# Patient Record
Sex: Male | Born: 1975 | Race: White | Hispanic: No | Marital: Single | Smoking: Current every day smoker
Health system: Southern US, Community
[De-identification: ages and names within clinical notes are randomized; demographics above are authoritative.]

## PROBLEM LIST (undated history)

## (undated) DIAGNOSIS — E7800 Pure hypercholesterolemia, unspecified: Secondary | ICD-10-CM

## (undated) DIAGNOSIS — M549 Dorsalgia, unspecified: Secondary | ICD-10-CM

## (undated) DIAGNOSIS — M109 Gout, unspecified: Secondary | ICD-10-CM

## (undated) DIAGNOSIS — I10 Essential (primary) hypertension: Secondary | ICD-10-CM

---
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
## 2000-11-29 HISTORY — PX: WRIST SURGERY: SHX841

## 2002-05-22 ENCOUNTER — Emergency Department (HOSPITAL_COMMUNITY): Admission: EM | Admit: 2002-05-22 | Discharge: 2002-05-22 | Payer: Self-pay | Admitting: *Deleted

## 2002-07-13 ENCOUNTER — Ambulatory Visit (HOSPITAL_BASED_OUTPATIENT_CLINIC_OR_DEPARTMENT_OTHER): Admission: RE | Admit: 2002-07-13 | Discharge: 2002-07-13 | Payer: Self-pay | Admitting: Orthopedic Surgery

## 2003-04-17 ENCOUNTER — Emergency Department (HOSPITAL_COMMUNITY): Admission: EM | Admit: 2003-04-17 | Discharge: 2003-04-18 | Payer: Self-pay | Admitting: Emergency Medicine

## 2010-06-18 ENCOUNTER — Emergency Department (HOSPITAL_COMMUNITY): Admission: EM | Admit: 2010-06-18 | Discharge: 2010-06-18 | Payer: Self-pay | Admitting: Emergency Medicine

## 2011-04-16 NOTE — H&P (Signed)
   NAME:  Ian Wall, Ian Wall                          ACCOUNT NO.:  [id number]   MEDICAL RECORD NO.:  [id number]                   PATIENT TYPE:  MS   LOCATION:  ED                                   FACILITY:  [facility]   PHYSICIAN:  Nicki Reaper, M.D.                 DATE OF BIRTH:  February 28, 1976   DATE OF ADMISSION:  DATE OF DISCHARGE:                                HISTORY & PHYSICAL   PREOPERATIVE DIAGNOSIS:  Laceration extensor pollicis longus left thumb.   POSTOPERATIVE DIAGNOSIS:  Laceration extensor pollicis longus left thumb.   OPERATION:  Repair extensor pollicis longus left thumb.   SURGEON:  Nicki Reaper, M.D.   ANESTHESIA:  IV regional.   DATE OF OPERATION:  07/13/02.   ANESTHESIOLOGIST:  Guadalupe Maple, M.D.   HISTORY:  The patient is a 35 year old male who suffered a laceration of his  metacarpal of his left thumb.  He has a laceration to the EPL tendon with  retraction.   PROCEDURE:  The patient was brought to the operating room where an upper arm  IV regional anesthetic was carried out without difficulty.  He was prepped  and draped using Duraprep.  The sutures were removed.  The laceration  extended proximally and distally.  The distal end was found without  difficulty.  The proximal end was at the wrist with undermining of the soft  tissues.  This was able to be found in its tract and delivered distally,  pinned with a bony 1 gauge needle and a repaired performed with a modified  Kessler using 4-0 Mersilene as a court suture and figure-of-eight 4-0  Mersilene in the upper tenon.  The wound was irrigated.  Skin was closed  with interrupted 5-0 nylon sutures.  A sterile compressive dressing and  splint including the thumb and wrist was applied.   The patient tolerated the procedure well and was taken to the recovery room  for observation in satisfactory condition.  He is discharged home to return  to the [facility] in [place] in one week on Vicodin and  Keflex.                                               Nicki Reaper, M.D.    GRK/MEDQ  D:  07/13/2002  T:  07/17/2002  Job:  [id number]

## 2011-06-12 ENCOUNTER — Emergency Department (HOSPITAL_COMMUNITY)
Admission: EM | Admit: 2011-06-12 | Discharge: 2011-06-12 | Disposition: A | Discharge status: Home / Self Care | Payer: Self-pay | Attending: Emergency Medicine | Admitting: Emergency Medicine

## 2011-06-12 DIAGNOSIS — F17200 Nicotine dependence, unspecified, uncomplicated: Secondary | ICD-10-CM | POA: Insufficient documentation

## 2011-06-12 DIAGNOSIS — K047 Periapical abscess without sinus: Secondary | ICD-10-CM | POA: Insufficient documentation

## 2011-06-12 DIAGNOSIS — R220 Localized swelling, mass and lump, head: Secondary | ICD-10-CM | POA: Insufficient documentation

## 2011-06-12 DIAGNOSIS — K029 Dental caries, unspecified: Secondary | ICD-10-CM | POA: Insufficient documentation

## 2011-06-12 DIAGNOSIS — R221 Localized swelling, mass and lump, neck: Secondary | ICD-10-CM | POA: Insufficient documentation

## 2011-06-12 HISTORY — DX: Dorsalgia, unspecified: M54.9

## 2011-06-12 LAB — BASIC METABOLIC PANEL
BUN: 13 mg/dL (ref 6–23)
Creatinine, Ser: 0.89 mg/dL (ref 0.50–1.35)
GFR calc Af Amer: >60 mL/min (ref >60)
GFR calc non Af Amer: >60 mL/min (ref >60)

## 2011-06-12 LAB — CBC
HCT: 45 % (ref 39.0–52.0)
Hemoglobin: 15.2 g/dL (ref 13.0–17.0)
MCH: 31.1 pg (ref 26.0–34.0)
MCHC: 33.8 g/dL (ref 30.0–36.0)
MCV: 92 fL (ref 78.0–100.0)

## 2011-06-12 LAB — DIFFERENTIAL
Basophils Relative: 0 % (ref 0–1)
Eosinophils Absolute: 1.2 10*3/uL — ABNORMAL HIGH (ref 0.0–0.7)
Eosinophils Relative: 9 % — ABNORMAL HIGH (ref 0–5)
Monocytes Absolute: 0.8 10*3/uL (ref 0.1–1.0)
Monocytes Relative: 6 % (ref 3–12)

## 2011-06-12 MED ORDER — HYDROCODONE-ACETAMINOPHEN 5-500 MG PO TABS: 1.0000 | ORAL_TABLET | Freq: Four times a day (QID) | ORAL | Status: AC | PRN

## 2011-06-12 MED ORDER — PENICILLIN V POTASSIUM 500 MG PO TABS: 500.0000 mg | ORAL_TABLET | Freq: Four times a day (QID) | ORAL | Status: AC

## 2011-06-12 NOTE — ED Notes (Signed)
Patient with no complaints at this time. Respirations even and unlabored. Skin warm/dry. Discharge instructions reviewed with patient at this time. Patient given opportunity to voice concerns/ask questions. Patient discharged at this time and left Emergency Department with steady gait.   

## 2011-06-12 NOTE — ED Notes (Signed)
Pt presents with left sided facial swelling x 1 month. Pt states prior to facial swelling pt had a toothache that gradually increased.

## 2011-06-12 NOTE — ED Notes (Signed)
MD at bedside. 

## 2011-06-12 NOTE — ED Provider Notes (Addendum)
History     Chief Complaint  Patient presents with  . Facial Swelling   The history is provided by the patient. No language interpreter was used.  It started with left 1-2 molar dental pain 3 months ago and then developed facial swelling over the angle of the left mandible and pain with chewing.  No fever/c/r.  No dysphagia, no odynophagia, no swelling of the neck.  No weight loss.  No night sweats.  No trismus.  Pain is a 10/10 at worst. Non radiating.  No rashes on the skin.  No CP, SOB, no wheezing or stridor. No easy bruising or bleeding, no gum bleeding  Past Medical History  Diagnosis Date  . Gout   . Back pain     History reviewed. No pertinent past surgical history.  History reviewed. No pertinent family history.  History  Substance Use Topics  . Smoking status: Current Everyday Smoker -- 1.0 packs/day    Types: Cigarettes  . Smokeless tobacco: Not on file  . Alcohol Use: No      Review of Systems  Constitutional: Negative for activity change.  HENT: Positive for facial swelling. Negative for hearing loss, nosebleeds, congestion, rhinorrhea, sneezing, neck pain, neck stiffness, postnasal drip, tinnitus and ear discharge.   Eyes: Negative for discharge.  Respiratory: Negative for apnea.   Cardiovascular: Negative for chest pain.  Gastrointestinal: Negative for abdominal distention.  Genitourinary: Negative for difficulty urinating.  Musculoskeletal: Negative for arthralgias.  Neurological: Negative for dizziness.  Hematological: Negative for adenopathy.  Psychiatric/Behavioral: Negative for agitation.    Physical Exam  BP 168/94  Pulse 90  Temp(Src) 98.6 F (37 C) (Oral)  Resp 18  Ht 5\' 7"  (1.702 m)  Wt 235 lb (106.595 kg)  BMI 36.81 kg/m2  SpO2 99%  Physical Exam  Constitutional: He is oriented to person, place, and time. He appears well-developed and well-nourished.  HENT:  Head: Normocephalic and atraumatic. No trismus in the jaw.  Right Ear:  External ear normal.  Left Ear: External ear normal.  Nose: Nose normal.  Mouth/Throat: Oropharynx is clear and moist and mucous membranes are normal. Mucous membranes are not pale and not cyanotic. No oral lesions. Abnormal dentition. Dental caries present. No dental abscesses, uvula swelling or lacerations. No oropharyngeal exudate, posterior oropharyngeal edema, posterior oropharyngeal erythema or tonsillar abscesses.         No appreciable facial swelling on the left, no trismus.    Eyes: EOM are normal. Pupils are equal, round, and reactive to light.  Neck: Trachea normal and normal range of motion. Neck supple. Normal carotid pulses and no JVD present. Carotid bruit is not present. No Brudzinski's sign and no Kernig's sign noted. No mass and no thyromegaly present.  Cardiovascular: Normal rate and regular rhythm.   Pulmonary/Chest: Effort normal and breath sounds normal.  Abdominal: Soft. Bowel sounds are normal.  Musculoskeletal: Normal range of motion.  Lymphadenopathy:       Head (right side): No submental, no submandibular, no tonsillar, no preauricular, no posterior auricular and no occipital adenopathy present.       Head (left side): Submandibular adenopathy present. No submental, no tonsillar, no preauricular, no posterior auricular and no occipital adenopathy present.    He has no cervical adenopathy.       Right cervical: No superficial cervical and no deep cervical adenopathy present.      Left cervical: No superficial cervical and no deep cervical adenopathy present.    He has no axillary adenopathy.  Right: No supraclavicular and no epitrochlear adenopathy present.       Left: No supraclavicular and no epitrochlear adenopathy present.       1 cm freely mobile  Neurological: He is alert and oriented to person, place, and time.  Skin: Skin is warm and dry.  Psychiatric: He has a normal mood and affect.    ED Course  Procedures  MDM  Patient informed he should  follow up with dentistry.  Return immediately for worsening swelling, fevers, neck stiffness, night sweats or any concerning symptoms and to follow up with family doctor.  Patient and wife verbalize understanding and agree to follow up     Rikia Sukhu K Kacelyn Rowzee-Rasch, MD 06/12/11 1845  Joselyn Edling K Paulette Lynch-Rasch, MD 06/13/11 2142

## 2011-06-20 ENCOUNTER — Emergency Department (HOSPITAL_COMMUNITY)
Admission: EM | Admit: 2011-06-20 | Discharge: 2011-06-20 | Disposition: A | Discharge status: Home / Self Care | Payer: Self-pay | Attending: Emergency Medicine | Admitting: Emergency Medicine

## 2011-06-20 ENCOUNTER — Encounter (HOSPITAL_COMMUNITY): Payer: Self-pay | Admitting: *Deleted

## 2011-06-20 DIAGNOSIS — K029 Dental caries, unspecified: Secondary | ICD-10-CM | POA: Insufficient documentation

## 2011-06-20 DIAGNOSIS — F17200 Nicotine dependence, unspecified, uncomplicated: Secondary | ICD-10-CM | POA: Insufficient documentation

## 2011-06-20 MED ORDER — HYDROCODONE-ACETAMINOPHEN 5-325 MG PO TABS: 1.0000 | ORAL_TABLET | ORAL | Status: AC | PRN

## 2011-06-20 MED ORDER — ONDANSETRON HCL 4 MG PO TABS
4.0000 mg | ORAL_TABLET | Freq: Once | ORAL | Status: AC
  Administered 2011-06-20: 4 mg via ORAL
  Filled 2011-06-20: qty 1

## 2011-06-20 MED ORDER — PENICILLIN V POTASSIUM 250 MG PO TABS
500.0000 mg | ORAL_TABLET | Freq: Once | ORAL | Status: AC
  Administered 2011-06-20: 500 mg via ORAL
  Filled 2011-06-20: qty 2

## 2011-06-20 MED ORDER — IBUPROFEN 800 MG PO TABS
800.0000 mg | ORAL_TABLET | Freq: Once | ORAL | Status: AC
  Administered 2011-06-20: 800 mg via ORAL
  Filled 2011-06-20: qty 1

## 2011-06-20 MED ORDER — AMOXICILLIN 500 MG PO CAPS: ORAL_CAPSULE | ORAL | Status: DC

## 2011-06-20 NOTE — Progress Notes (Signed)
Plan discussed with patient. Discussed need for pt to see a dentist as soon as possible. Pt acknowledges instruction and states he is trying to get into a free dental clinic.

## 2011-06-20 NOTE — ED Provider Notes (Signed)
History     Chief Complaint  Patient presents with  . Dental Pain   Patient is a 35 y.o. male presenting with tooth pain. The history is provided by the patient.  Dental PainThe primary symptoms include mouth pain. Primary symptoms do not include shortness of breath or cough. The symptoms began more than 1 month ago. The symptoms are worsening. The symptoms occur constantly.  Additional symptoms include: dental sensitivity to temperature and gum swelling. Additional symptoms do not include: nosebleeds.    Past Medical History  Diagnosis Date  . Gout   . Back pain     History reviewed. No pertinent past surgical history.  No family history on file.  History  Substance Use Topics  . Smoking status: Current Everyday Smoker -- 1.0 packs/day    Types: Cigarettes  . Smokeless tobacco: Not on file  . Alcohol Use: No      Review of Systems  Constitutional: Negative for activity change.       All ROS Neg except as noted in HPI  HENT: Positive for dental problem. Negative for nosebleeds and neck pain.   Eyes: Negative for photophobia and discharge.  Respiratory: Negative for cough, shortness of breath and wheezing.   Cardiovascular: Negative for chest pain and palpitations.  Gastrointestinal: Negative for abdominal pain and blood in stool.  Genitourinary: Negative for dysuria, frequency and hematuria.  Musculoskeletal: Negative for back pain and arthralgias.  Skin: Negative.   Neurological: Negative for dizziness, seizures and speech difficulty.  Psychiatric/Behavioral: Negative for hallucinations and confusion.    Physical Exam  BP 169/99  Pulse 80  Temp(Src) 98.9 F (37.2 C) (Oral)  Resp 18  Ht 5\' 6"  (1.676 m)  Wt 234 lb (106.142 kg)  BMI 37.77 kg/m2  SpO2 99%  Physical Exam  Nursing note and vitals reviewed. Constitutional: He is oriented to person, place, and time. He appears well-developed and well-nourished.  Non-toxic appearance.  HENT:  Head: Normocephalic.   Right Ear: Tympanic membrane and external ear normal.  Left Ear: Tympanic membrane and external ear normal.       Multiple dental caries. Mild gum swelling of the left lower molars. No visible abscess.  Eyes: EOM and lids are normal. Pupils are equal, round, and reactive to light.  Neck: Normal range of motion. Neck supple. Carotid bruit is not present.  Cardiovascular: Normal rate, regular rhythm, normal heart sounds, intact distal pulses and normal pulses.   Pulmonary/Chest: No respiratory distress. He has rhonchi in the right upper field, the right middle field, the right lower field, the left upper field, the left middle field and the left lower field.  Abdominal: Soft. Bowel sounds are normal. There is no tenderness. There is no guarding.  Musculoskeletal: Normal range of motion.  Lymphadenopathy:       Head (right side): No submandibular adenopathy present.       Head (left side): No submandibular adenopathy present.    He has no cervical adenopathy.  Neurological: He is alert and oriented to person, place, and time. He has normal strength. No cranial nerve deficit or sensory deficit.  Skin: Skin is warm and dry.  Psychiatric: He has a normal mood and affect. His speech is normal.    ED Course  Procedures  MDM I have reviewed nursing notes, vital signs, and all appropriate lab and imaging results for this patient.      Kathie Dike, [place] 06/20/11 1926

## 2011-06-20 NOTE — ED Notes (Signed)
C/o pain to left side tooth x 1 month.  Seen last week and given rx abx.  States finished abx and when finished, pain and swelling came back.

## 2011-06-20 NOTE — ED Notes (Signed)
Pt a/ox4. Resp even and unlabored. NAD at this time. Pt ambulated to d/c desk with steady gate.

## 2011-06-21 NOTE — ED Provider Notes (Signed)
Evaluation and management procedures were performed by the mid-level provider (PA/NP/CNM) under my supervision/collaboration.      Vida Roller, MD 06/21/11 [phone]

## 2011-06-21 NOTE — Progress Notes (Signed)
Evaluation and management procedures were performed by the mid-level provider (PA/NP/CNM) under my supervision/collaboration.  

## 2012-07-11 ENCOUNTER — Emergency Department (HOSPITAL_COMMUNITY)
Admission: EM | Admit: 2012-07-11 | Discharge: 2012-07-11 | Disposition: A | Discharge status: Home / Self Care | Payer: Self-pay | Attending: Emergency Medicine | Admitting: Emergency Medicine

## 2012-07-11 ENCOUNTER — Encounter (HOSPITAL_COMMUNITY): Payer: Self-pay

## 2012-07-11 ENCOUNTER — Emergency Department (HOSPITAL_COMMUNITY): Payer: Self-pay

## 2012-07-11 DIAGNOSIS — S91009A Unspecified open wound, unspecified ankle, initial encounter: Secondary | ICD-10-CM | POA: Insufficient documentation

## 2012-07-11 DIAGNOSIS — T148XXA Other injury of unspecified body region, initial encounter: Secondary | ICD-10-CM

## 2012-07-11 DIAGNOSIS — F17200 Nicotine dependence, unspecified, uncomplicated: Secondary | ICD-10-CM | POA: Insufficient documentation

## 2012-07-11 DIAGNOSIS — M25562 Pain in left knee: Secondary | ICD-10-CM

## 2012-07-11 DIAGNOSIS — Y9289 Other specified places as the place of occurrence of the external cause: Secondary | ICD-10-CM | POA: Insufficient documentation

## 2012-07-11 DIAGNOSIS — Y990 Civilian activity done for income or pay: Secondary | ICD-10-CM | POA: Insufficient documentation

## 2012-07-11 DIAGNOSIS — W268XXA Contact with other sharp object(s), not elsewhere classified, initial encounter: Secondary | ICD-10-CM | POA: Insufficient documentation

## 2012-07-11 DIAGNOSIS — M109 Gout, unspecified: Secondary | ICD-10-CM | POA: Insufficient documentation

## 2012-07-11 DIAGNOSIS — S81009A Unspecified open wound, unspecified knee, initial encounter: Secondary | ICD-10-CM | POA: Insufficient documentation

## 2012-07-11 DIAGNOSIS — E7800 Pure hypercholesterolemia, unspecified: Secondary | ICD-10-CM | POA: Insufficient documentation

## 2012-07-11 DIAGNOSIS — I10 Essential (primary) hypertension: Secondary | ICD-10-CM | POA: Insufficient documentation

## 2012-07-11 HISTORY — DX: Pure hypercholesterolemia, unspecified: E78.00

## 2012-07-11 HISTORY — DX: Essential (primary) hypertension: I10

## 2012-07-11 HISTORY — DX: Gout, unspecified: M10.9

## 2012-07-11 MED ORDER — AMOXICILLIN-POT CLAVULANATE 875-125 MG PO TABS
1.0000 | ORAL_TABLET | Freq: Once | ORAL | Status: AC
  Administered 2012-07-11: 1 via ORAL
  Filled 2012-07-11: qty 1

## 2012-07-11 MED ORDER — SULFAMETHOXAZOLE-TRIMETHOPRIM 800-160 MG PO TABS: 1.0000 | ORAL_TABLET | Freq: Two times a day (BID) | ORAL | Status: AC

## 2012-07-11 MED ORDER — HYDROCODONE-ACETAMINOPHEN 5-325 MG PO TABS
1.0000 | ORAL_TABLET | Freq: Once | ORAL | Status: AC
  Administered 2012-07-11: 1 via ORAL
  Filled 2012-07-11: qty 1

## 2012-07-11 MED ORDER — HYDROCODONE-ACETAMINOPHEN 5-325 MG PO TABS: ORAL_TABLET | ORAL | Status: AC

## 2012-07-11 NOTE — ED Provider Notes (Signed)
History     CSN: [id number]  Arrival date & time 07/11/12  4098   First MD Initiated Contact with Patient 07/11/12 [phone]      Chief Complaint  Patient presents with  . Knee Pain    (Consider location/radiation/quality/duration/timing/severity/associated sxs/prior treatment) HPI Comments: Patient complains of pain to his left knee. He states pain began at work yesterday after he accidentally punctured the skin of his knee on a metal tack.  He states pain is worse with bending his knee or walking. Pain improves with rest. He states that he cleaned the wound with soap and water. Pain was worse this morning.  He denies bleeding, swelling, redness, numbness or weakness. He states that he is up-to-date on his tetanus vaccine.  Patient is a 36 y.o. male presenting with knee pain. The history is provided by the patient.  Knee Pain This is a new problem. The current episode started yesterday. The problem occurs constantly. The problem has been unchanged. Associated symptoms include arthralgias. Pertinent negatives include no chills, fever, joint swelling, nausea, neck pain, numbness, rash or weakness. The symptoms are aggravated by twisting, walking and bending. He has tried nothing for the symptoms. The treatment provided mild relief.    Past Medical History  Diagnosis Date  . Gout   . Back pain   . Hypertension   . Hypercholesteremia   . Gout     History reviewed. No pertinent past surgical history.  No family history on file.  History  Substance Use Topics  . Smoking status: Current Everyday Smoker -- 1.0 packs/day    Types: Cigarettes  . Smokeless tobacco: Not on file  . Alcohol Use: No      Review of Systems  Constitutional: Negative for fever and chills.  HENT: Negative for neck pain.   Gastrointestinal: Negative for nausea.  Genitourinary: Negative for dysuria and difficulty urinating.  Musculoskeletal: Positive for arthralgias. Negative for joint swelling and gait  problem.  Skin: Positive for wound. Negative for color change and rash.       Puncture wound left knee  Neurological: Negative for weakness and numbness.  All other systems reviewed and are negative.    Allergies  Review of patient's allergies indicates no known allergies.  Home Medications   Current Outpatient Rx  Name Route Sig Dispense Refill  . IBUPROFEN 200 MG PO TABS Oral Take 400-800 mg by mouth every 6 (six) hours as needed. Pain    . LISINOPRIL 10 MG PO TABS Oral Take 10 mg by mouth daily.    Marland Kitchen PRAVASTATIN SODIUM 40 MG PO TABS Oral Take 40 mg by mouth daily.      BP 145/76  Pulse 82  Temp 98.7 F (37.1 C) (Oral)  Resp 20  Ht 5\' 6"  (1.676 m)  Wt 245 lb (111.131 kg)  BMI 39.54 kg/m2  SpO2 98%  Physical Exam  Nursing note and vitals reviewed. Constitutional: He is oriented to person, place, and time. He appears well-developed and well-nourished. No distress.  Cardiovascular: Normal rate, regular rhythm, normal heart sounds and intact distal pulses.   Pulmonary/Chest: Effort normal and breath sounds normal.  Musculoskeletal: He exhibits tenderness. He exhibits no edema.       Left knee: He exhibits normal range of motion, no swelling, no effusion, no ecchymosis, no deformity, no erythema and no bony tenderness. tenderness found. Patellar tendon tenderness noted.       ttp of the anterior left knee, over the patellar tendon.  No erythema, bruising,  bleeding, effusion or deformity.  Patient has full range of motion of the left knee the pain is reproduced with full extension or full flexion. DP pulse is brisk, sensation is intact.  Neurological: He is alert and oriented to person, place, and time. He exhibits normal muscle tone. Coordination normal.  Skin: Skin is warm and dry. No erythema.    ED Course  Procedures (including critical care time)  Labs Reviewed - No data to display Dg Knee Complete 4 Views Left  07/11/2012  *RADIOLOGY REPORT*  Clinical Data: Knee pain.   LEFT KNEE - COMPLETE 4+ VIEW  Comparison: None.  Findings: No acute bony abnormality.  Specifically, no fracture, subluxation, or dislocation.  Soft tissues are intact.  No joint effusion.  IMPRESSION: Normal study.  Original Report Authenticated By: Cyndie Chime, M.D.     Immobilizer applied, pain improved, he remains neurovascularly intact. Patient has crutches at home.  MDM    Wound to the knee was cleaned by the nursing staff and bandaged.  Patient with a small puncture wound over the left patella tendon. No clinical signs of infection at this time. I will prescribe antibiotics prophylactically. Have also advised the patient of the risk of infection, and he understands that he needs close orthopedic followup.   The patient appears reasonably screened and/or stabilized for discharge and I doubt any other medical condition or other [facility] requiring further screening, evaluation, or treatment in the ED at this time prior to discharge.   Prescribed: norco #20 Bactrim DS   Zarahi Fuerst L. Jalilah Wiltsie, [place] 07/11/12 1017

## 2012-07-11 NOTE — ED Notes (Signed)
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 To have pain and is unable to walk

## 2012-07-11 NOTE — ED Provider Notes (Signed)
Medical screening examination/treatment/procedure(s) were performed by non-physician practitioner and as supervising physician I was immediately available for consultation/collaboration.   Benny Lennert, MD 07/11/12 1057

## 2013-02-12 ENCOUNTER — Encounter (HOSPITAL_COMMUNITY): Payer: Self-pay | Admitting: *Deleted

## 2013-02-12 ENCOUNTER — Emergency Department (HOSPITAL_COMMUNITY)
Admission: EM | Admit: 2013-02-12 | Discharge: 2013-02-12 | Disposition: A | Discharge status: Home / Self Care | Payer: Self-pay | Attending: Emergency Medicine | Admitting: Emergency Medicine

## 2013-02-12 DIAGNOSIS — G8929 Other chronic pain: Secondary | ICD-10-CM | POA: Insufficient documentation

## 2013-02-12 DIAGNOSIS — F17200 Nicotine dependence, unspecified, uncomplicated: Secondary | ICD-10-CM | POA: Insufficient documentation

## 2013-02-12 DIAGNOSIS — Y9389 Activity, other specified: Secondary | ICD-10-CM | POA: Insufficient documentation

## 2013-02-12 DIAGNOSIS — S39012A Strain of muscle, fascia and tendon of lower back, initial encounter: Secondary | ICD-10-CM

## 2013-02-12 DIAGNOSIS — Z8639 Personal history of other endocrine, nutritional and metabolic disease: Secondary | ICD-10-CM | POA: Insufficient documentation

## 2013-02-12 DIAGNOSIS — Z862 Personal history of diseases of the blood and blood-forming organs and certain disorders involving the immune mechanism: Secondary | ICD-10-CM | POA: Insufficient documentation

## 2013-02-12 DIAGNOSIS — I10 Essential (primary) hypertension: Secondary | ICD-10-CM | POA: Insufficient documentation

## 2013-02-12 DIAGNOSIS — IMO0002 Reserved for concepts with insufficient information to code with codable children: Secondary | ICD-10-CM | POA: Insufficient documentation

## 2013-02-12 DIAGNOSIS — Y929 Unspecified place or not applicable: Secondary | ICD-10-CM | POA: Insufficient documentation

## 2013-02-12 DIAGNOSIS — X503XXA Overexertion from repetitive movements, initial encounter: Secondary | ICD-10-CM | POA: Insufficient documentation

## 2013-02-12 MED ORDER — METHOCARBAMOL 500 MG PO TABS
1000.0000 mg | ORAL_TABLET | Freq: Once | ORAL | Status: AC
  Administered 2013-02-12: 1000 mg via ORAL
  Filled 2013-02-12: qty 2

## 2013-02-12 MED ORDER — OXYCODONE-ACETAMINOPHEN 5-325 MG PO TABS: 1.0000 | ORAL_TABLET | ORAL | Status: DC | PRN

## 2013-02-12 MED ORDER — OXYCODONE-ACETAMINOPHEN 5-325 MG PO TABS
1.0000 | ORAL_TABLET | Freq: Once | ORAL | Status: AC
  Administered 2013-02-12: 1 via ORAL
  Filled 2013-02-12: qty 1

## 2013-02-12 MED ORDER — METHOCARBAMOL 500 MG PO TABS: 1000.0000 mg | ORAL_TABLET | Freq: Four times a day (QID) | ORAL | Status: DC

## 2013-02-12 NOTE — ED Provider Notes (Signed)
History     CSN: [id number]  Arrival date & time 02/12/13  1437   First MD Initiated Contact with Patient 02/12/13 1449      Chief Complaint  Patient presents with  . Back Pain    (Consider location/radiation/quality/duration/timing/severity/associated sxs/prior treatment) HPI Comments: Ian Wall is a 37 y.o. Male presenting with acute on chronic low back pain which he has had intermittently since a work related injury several years ago.  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  There is no radiation into the lower extremity currently,  But he has experienced this in the past.  There has been no weakness or numbness in the lower extremities and no urinary or bowel retention or incontinence.  Patient does not have a history of cancer or IVDU.  He has taken ibuprofen 800 mg which helps "some".     The history is provided by the patient and a parent.    Past Medical History  Diagnosis Date  . Gout   . Back pain   . Hypertension   . Hypercholesteremia   . Gout     History reviewed. No pertinent past surgical history.  History reviewed. No pertinent family history.  History  Substance Use Topics  . Smoking status: Current Every Day Smoker -- 1.00 packs/day    Types: Cigarettes  . Smokeless tobacco: Not on file  . Alcohol Use: No      Review of Systems  Constitutional: Negative for fever.  Respiratory: Negative for shortness of breath.   Cardiovascular: Negative for chest pain and leg swelling.  Gastrointestinal: Negative for abdominal pain, constipation and abdominal distention.  Genitourinary: Negative for dysuria, urgency, frequency, flank pain and difficulty urinating.  Musculoskeletal: Positive for back pain. Negative for joint swelling and gait  problem.  Skin: Negative for rash.  Neurological: Negative for weakness and numbness.    Allergies  Review of patient's allergies indicates no known allergies.  Home Medications   Current Outpatient Rx  Name  Route  Sig  Dispense  Refill  . ibuprofen (ADVIL,MOTRIN) 200 MG tablet   Oral   Take 400-800 mg by mouth every 6 (six) hours as needed. Pain         . methocarbamol (ROBAXIN) 500 MG tablet   Oral   Take 2 tablets (1,000 mg total) by mouth 4 (four) times daily.   40 tablet   0   . oxyCODONE-acetaminophen (PERCOCET/ROXICET) 5-325 MG per tablet   Oral   Take 1 tablet by mouth every 4 (four) hours as needed for pain.   30 tablet   0     BP 150/94  Pulse 84  Temp(Src) 97.9 F (36.6 C) (Oral)  Resp 20  Ht 5\' 5"  (1.651 m)  Wt 245 lb (111.131 kg)  BMI 40.77 kg/m2  SpO2 100%  Physical Exam  Nursing note and vitals reviewed. Constitutional: He appears well-developed and well-nourished.  HENT:  Head: Normocephalic.  Eyes: Conjunctivae are normal.  Neck: Normal range of motion. Neck supple.  Cardiovascular: Normal rate and intact distal pulses.   Pedal pulses normal.  Pulmonary/Chest: Effort normal.  Abdominal: Soft. Bowel sounds are normal. He exhibits no distension and no mass.  Musculoskeletal: Normal range of motion. He exhibits tenderness. He exhibits no edema.  Lumbar back: He exhibits tenderness, swelling, pain and spasm. He exhibits no bony tenderness and no edema.       Back:  Neurological: He is alert. He has normal strength. He displays no atrophy and no tremor. No sensory deficit. Gait normal.  Reflex Scores:      Patellar reflexes are 2+ on the right side and 2+ on the left side.      Achilles reflexes are 2+ on the right side and 2+ on the left side. No strength deficit noted in hip and knee flexor and extensor muscle groups.  Ankle flexion and extension intact.  Skin: Skin is warm and dry.  Psychiatric: He has a normal mood and affect.     ED Course  Procedures (including critical care time)  Labs Reviewed - No data to display No results found.   1. Lumbar strain, initial encounter       MDM  No neuro deficit on exam or by history to suggest emergent or surgical presentation.  Also discussed worsened sx that should prompt immediate re-evaluation including distal weakness, bowel/bladder retention/incontinence.      Pt was prescribed robaxin and oxycodone, encouraged to continue taking ibuprofen.  Heat.  Referrals given for primary care and for orthopedic care.        Burgess Amor, PA-C 02/12/13 1620

## 2013-02-12 NOTE — ED Notes (Signed)
Low back pain for 2 days. No injury known 

## 2013-02-12 NOTE — ED Notes (Signed)
Pt c/o lower back pain x2 days. Pt states he was "helping a friend with work" when pain began. Pt has hx of back pain.

## 2013-02-13 NOTE — ED Provider Notes (Signed)
Medical screening examination/treatment/procedure(s) were performed by non-physician practitioner and as supervising physician I was immediately available for consultation/collaboration.  Donnetta Hutching, MD 02/13/13 [phone]

## 2013-07-26 IMAGING — CR DG KNEE COMPLETE 4+V*L*
4 series · 4 of 4 positions shown · non-contrast
Comparison: None.

CLINICAL DATA: Knee pain.

LEFT KNEE - COMPLETE 4+ VIEW

[view not recorded (1 of 4)]
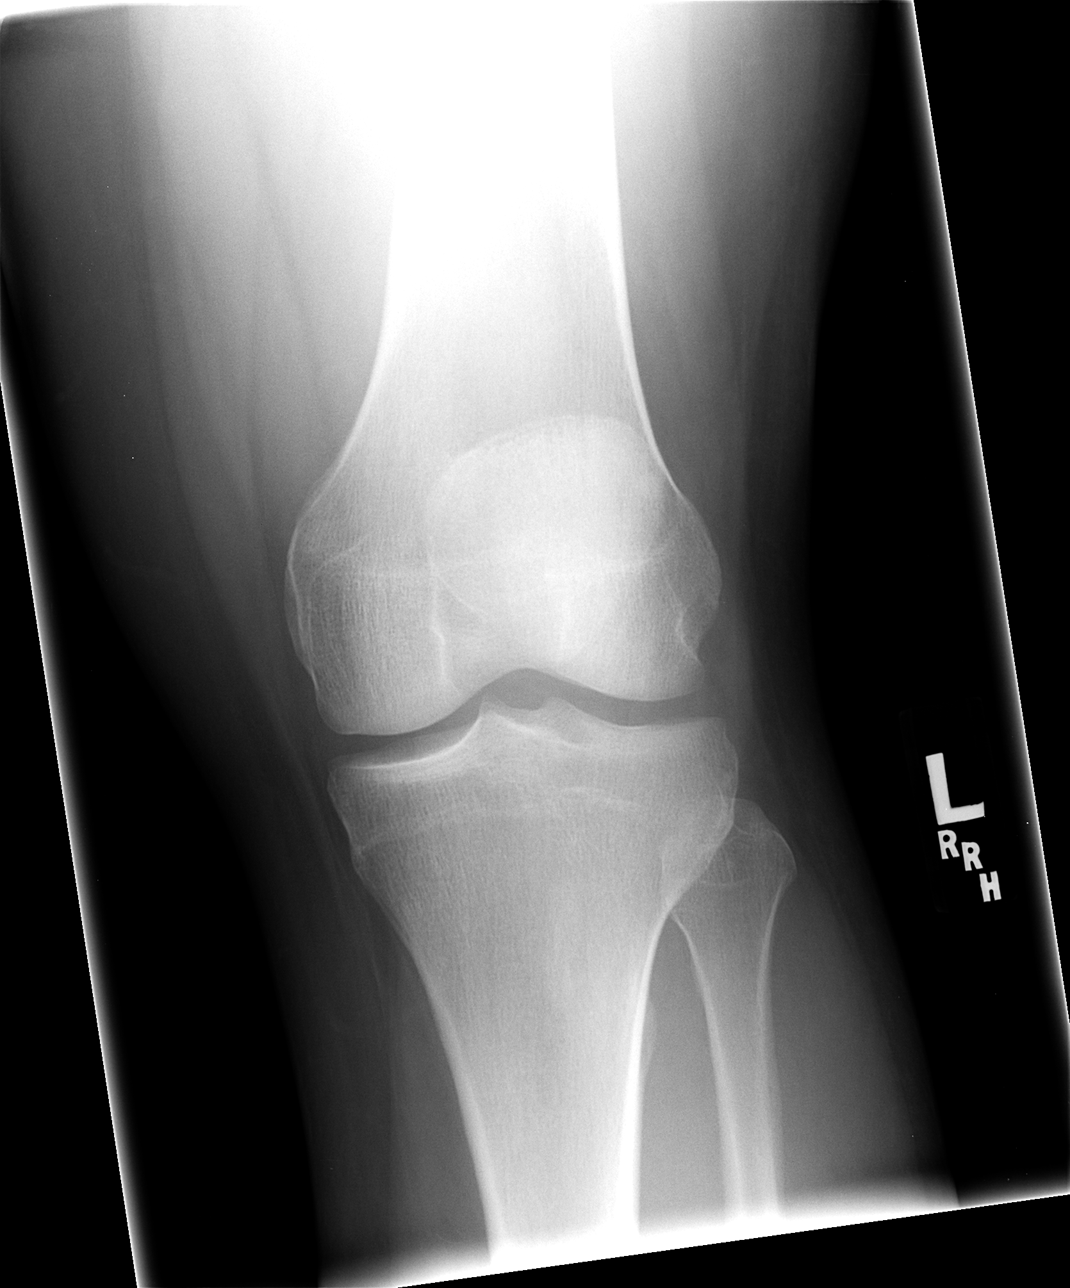

[view not recorded (2 of 4)]
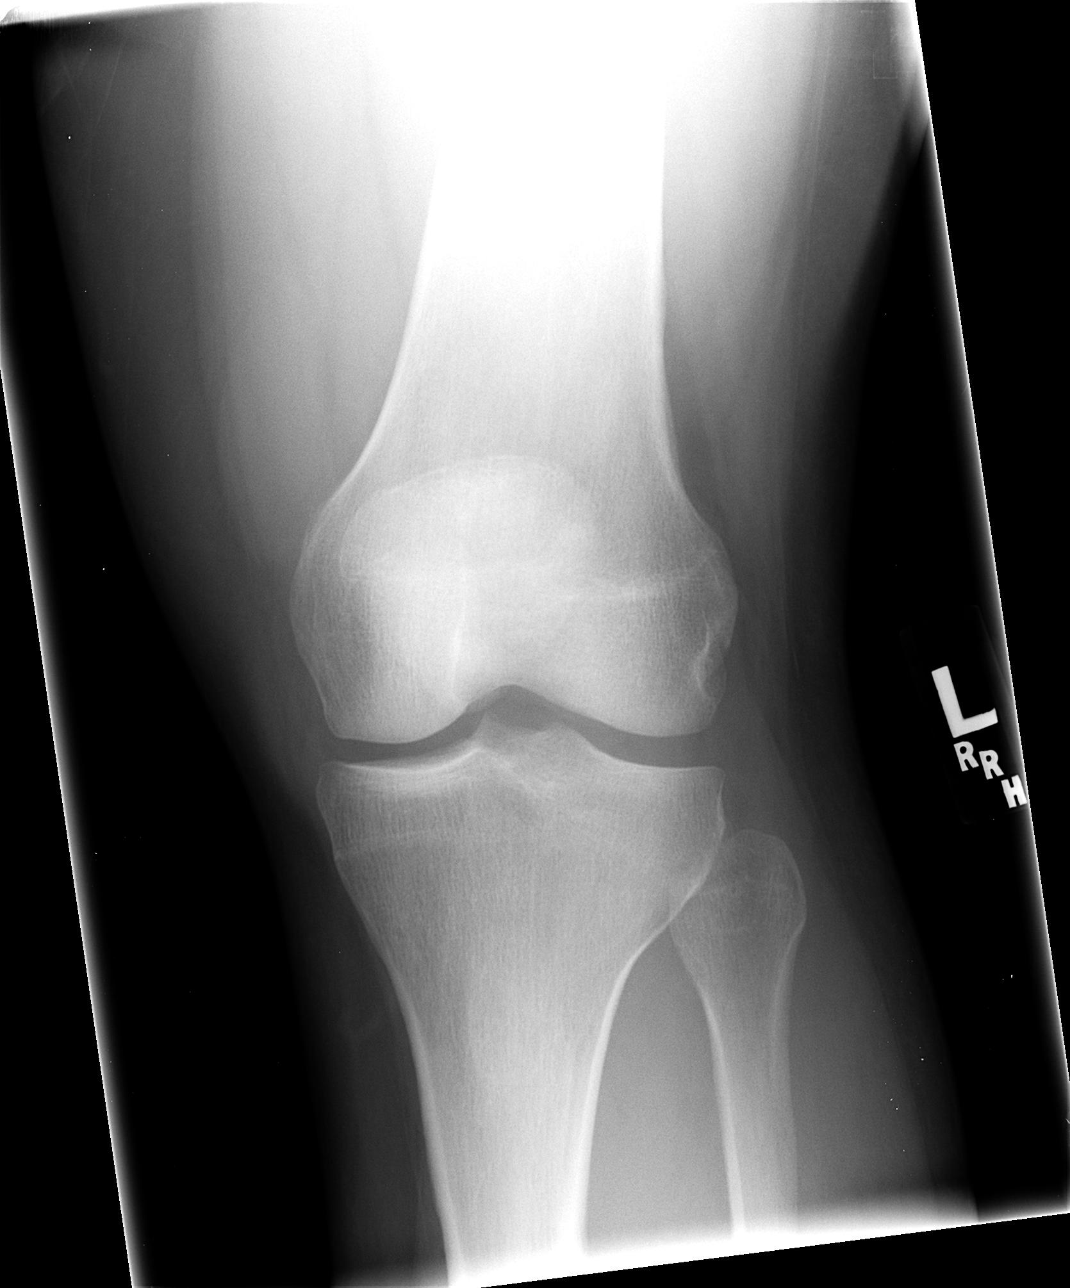

[view not recorded (3 of 4)]
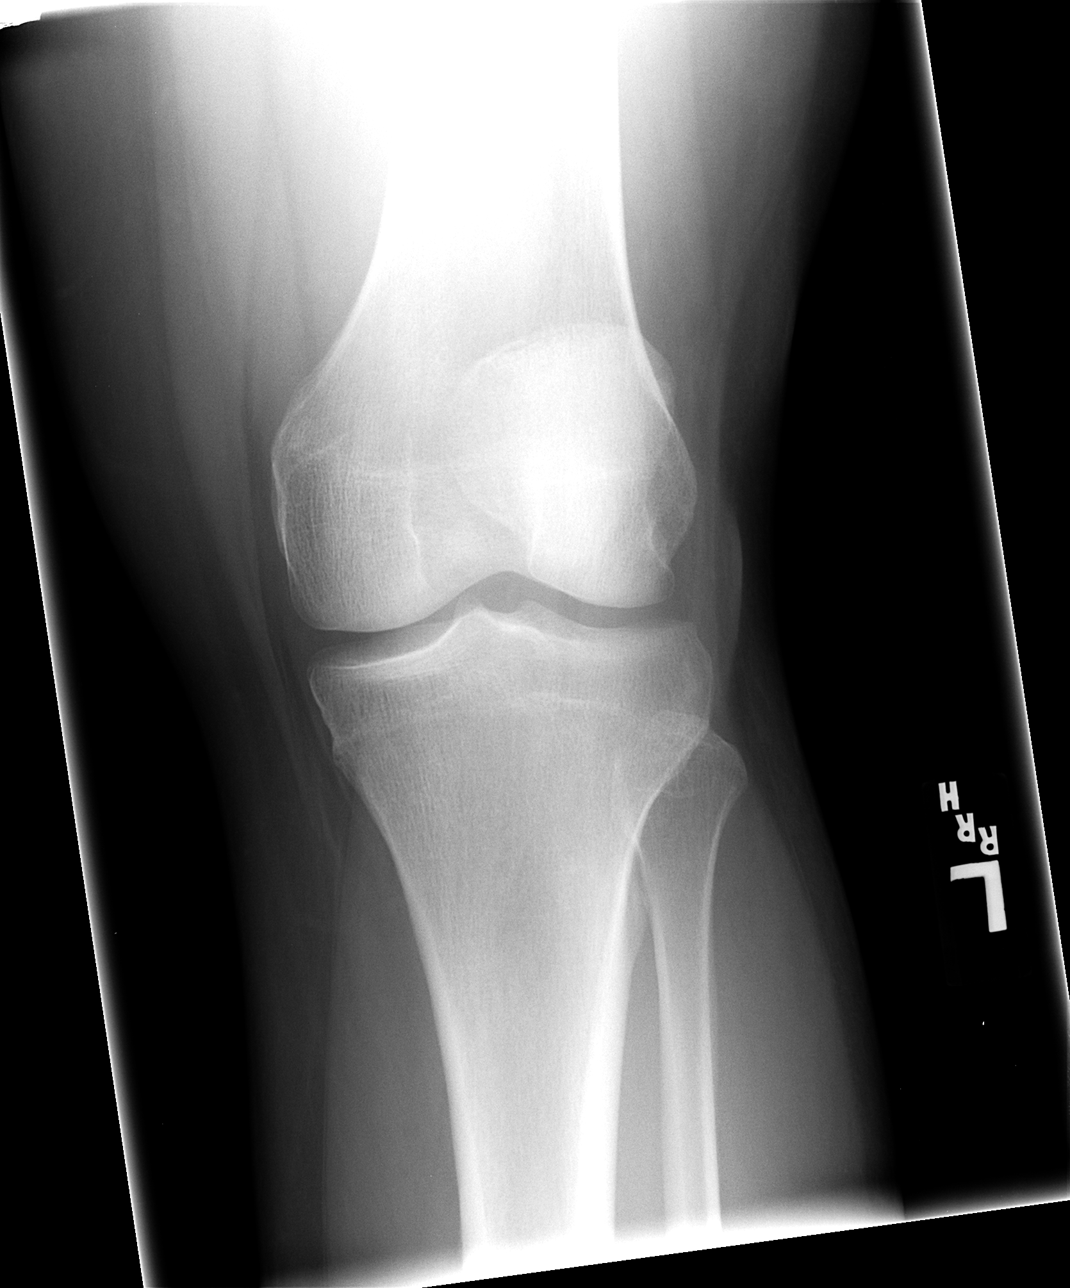

[view not recorded (4 of 4)]
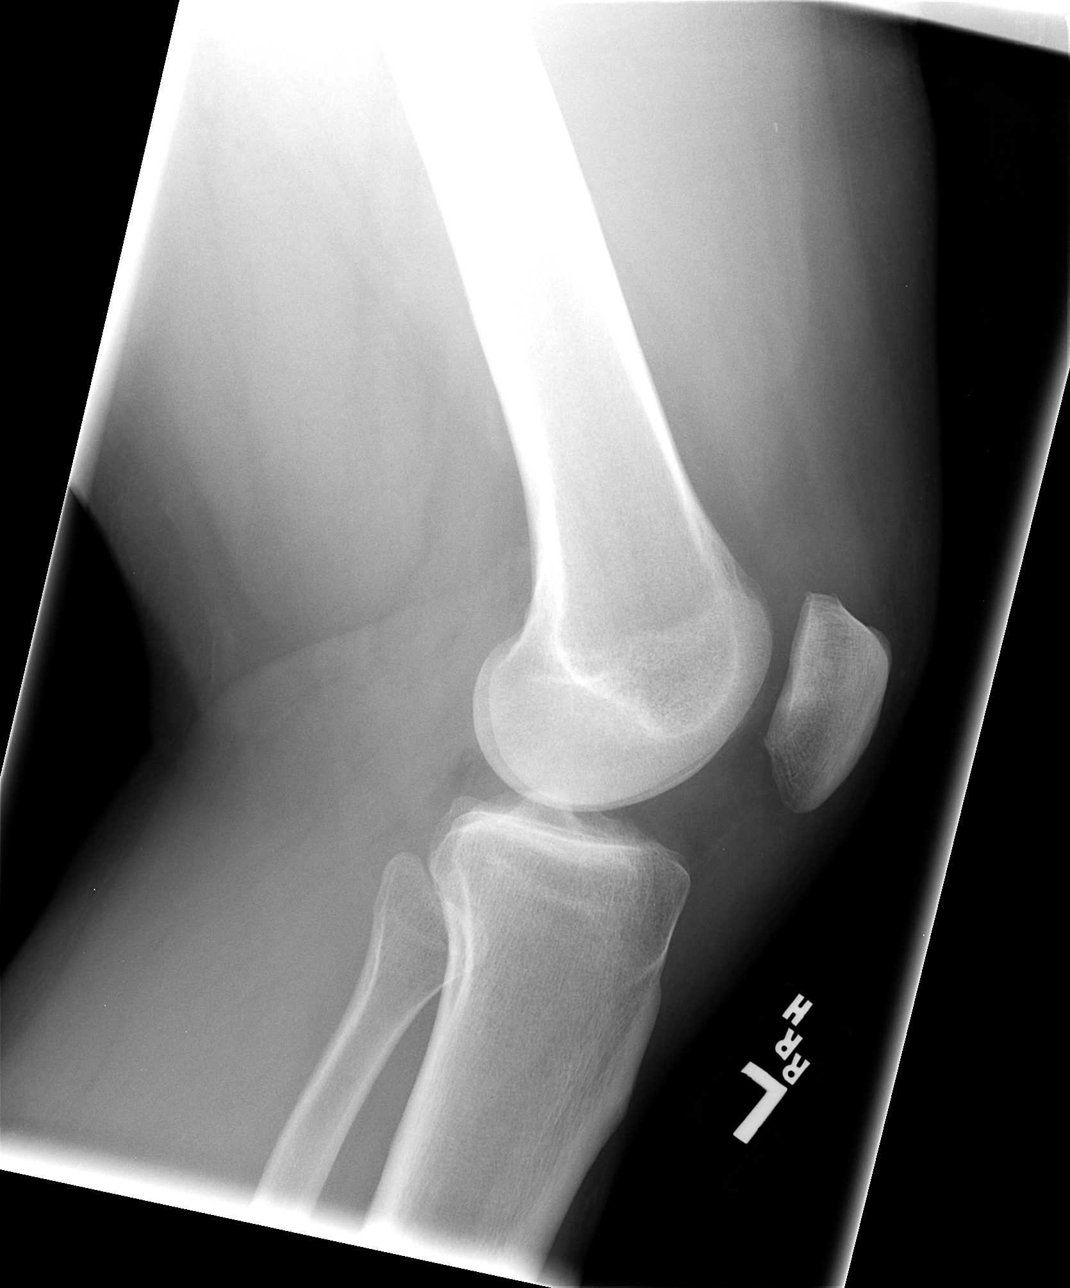

[4 of 4 positions shown; findings below may reference images not displayed]

FINDINGS: No acute bony abnormality.  Specifically, no fracture,
subluxation, or dislocation.  Soft tissues are intact.  No joint
effusion.
IMPRESSION: Normal study.

## 2013-09-13 ENCOUNTER — Emergency Department (HOSPITAL_COMMUNITY)
Admission: EM | Admit: 2013-09-13 | Discharge: 2013-09-13 | Disposition: A | Discharge status: Home / Self Care | Payer: Self-pay | Attending: Emergency Medicine | Admitting: Emergency Medicine

## 2013-09-13 ENCOUNTER — Encounter (HOSPITAL_COMMUNITY): Payer: Self-pay | Admitting: Emergency Medicine

## 2013-09-13 DIAGNOSIS — Z8639 Personal history of other endocrine, nutritional and metabolic disease: Secondary | ICD-10-CM | POA: Insufficient documentation

## 2013-09-13 DIAGNOSIS — F17200 Nicotine dependence, unspecified, uncomplicated: Secondary | ICD-10-CM | POA: Insufficient documentation

## 2013-09-13 DIAGNOSIS — Z862 Personal history of diseases of the blood and blood-forming organs and certain disorders involving the immune mechanism: Secondary | ICD-10-CM | POA: Insufficient documentation

## 2013-09-13 DIAGNOSIS — S335XXA Sprain of ligaments of lumbar spine, initial encounter: Secondary | ICD-10-CM | POA: Insufficient documentation

## 2013-09-13 DIAGNOSIS — Z79899 Other long term (current) drug therapy: Secondary | ICD-10-CM | POA: Insufficient documentation

## 2013-09-13 DIAGNOSIS — X500XXA Overexertion from strenuous movement or load, initial encounter: Secondary | ICD-10-CM | POA: Insufficient documentation

## 2013-09-13 DIAGNOSIS — Y9289 Other specified places as the place of occurrence of the external cause: Secondary | ICD-10-CM | POA: Insufficient documentation

## 2013-09-13 DIAGNOSIS — Y9389 Activity, other specified: Secondary | ICD-10-CM | POA: Insufficient documentation

## 2013-09-13 DIAGNOSIS — I10 Essential (primary) hypertension: Secondary | ICD-10-CM | POA: Insufficient documentation

## 2013-09-13 DIAGNOSIS — S39012A Strain of muscle, fascia and tendon of lower back, initial encounter: Secondary | ICD-10-CM

## 2013-09-13 MED ORDER — HYDROCODONE-ACETAMINOPHEN 5-325 MG PO TABS: 2.0000 | ORAL_TABLET | ORAL | Status: DC | PRN

## 2013-09-13 MED ORDER — HYDROCODONE-ACETAMINOPHEN 5-325 MG PO TABS
2.0000 | ORAL_TABLET | Freq: Once | ORAL | Status: AC
  Administered 2013-09-13: 2 via ORAL
  Filled 2013-09-13: qty 2

## 2013-09-13 MED ORDER — KETOROLAC TROMETHAMINE 60 MG/2ML IM SOLN
60.0000 mg | Freq: Once | INTRAMUSCULAR | Status: AC
  Administered 2013-09-13: 60 mg via INTRAMUSCULAR
  Filled 2013-09-13: qty 2

## 2013-09-13 NOTE — ED Notes (Signed)
Pt c/o lower back pain after taking dog out on leash this am. Pt denies any injury. He states he thinks he twisted wrong while putting the leash on dog.

## 2013-09-13 NOTE — ED Notes (Signed)
Pt c/o lower back pain

## 2013-09-13 NOTE — ED Provider Notes (Signed)
CSN: [id number]     Arrival date & time 09/13/13  0601 History   First MD Initiated Contact with Patient 09/13/13 0630     Chief Complaint  Patient presents with  . Back Pain   (Consider location/radiation/quality/duration/timing/severity/associated sxs/prior Treatment) HPI Comments: Patient is a 37 year old male past medical history significant for hypertension and high cholesterol. He presents today with complaints of pain in his lower back that started yesterday. It is worsened throughout the night. He denies any radiation into his legs. He denies any bowel or bladder complaints. He denies any weakness. He denies lifting anything heavy and denies any other injury or trauma.  Patient is a 37 y.o. male presenting with back pain. The history is provided by the patient.  Back Pain Location:  Lumbar spine Quality:  Stabbing Radiates to:  Does not radiate Pain severity:  Moderate Pain is:  Same all the time Onset quality:  Sudden Duration:  2 days Timing:  Constant Progression:  Worsening Chronicity:  New Relieved by:  Nothing Worsened by:  Ambulation, bending, movement, palpation and sitting Ineffective treatments:  Ibuprofen Associated symptoms: no abdominal pain, no bladder incontinence, no bowel incontinence, no fever, no numbness and no weakness     Past Medical History  Diagnosis Date  . Gout   . Back pain   . Hypertension   . Hypercholesteremia   . Gout    History reviewed. No pertinent past surgical history. History reviewed. No pertinent family history. History  Substance Use Topics  . Smoking status: Current Every Day Smoker -- 1.00 packs/day    Types: Cigarettes  . Smokeless tobacco: Not on file  . Alcohol Use: No    Review of Systems  Constitutional: Negative for fever.  Gastrointestinal: Negative for abdominal pain and bowel incontinence.  Genitourinary: Negative for bladder incontinence.  Musculoskeletal: Positive for back pain.  Neurological: Negative  for weakness and numbness.  All other systems reviewed and are negative.    Allergies  Review of patient's allergies indicates no known allergies.  Home Medications   Current Outpatient Rx  Name  Route  Sig  Dispense  Refill  . ibuprofen (ADVIL,MOTRIN) 200 MG tablet   Oral   Take 400-800 mg by mouth every 6 (six) hours as needed. Pain         . methocarbamol (ROBAXIN) 500 MG tablet   Oral   Take 2 tablets (1,000 mg total) by mouth 4 (four) times daily.   40 tablet   0   . oxyCODONE-acetaminophen (PERCOCET/ROXICET) 5-325 MG per tablet   Oral   Take 1 tablet by mouth every 4 (four) hours as needed for pain.   30 tablet   0    BP 151/97  Pulse 85  Temp(Src) 98.6 F (37 C) (Oral)  Resp 16  Ht 5\' 6"  (1.676 m)  Wt 250 lb (113.399 kg)  BMI 40.37 kg/m2  SpO2 97% Physical Exam  Nursing note and vitals reviewed. Constitutional: He is oriented to person, place, and time. He appears well-developed and well-nourished. No distress.  HENT:  Head: Normocephalic and atraumatic.  Neck: Normal range of motion. Neck supple.  Abdominal: Soft. Bowel sounds are normal.  Musculoskeletal: Normal range of motion.  There is tenderness to palpation in the soft tissues of the right lower lumbar region. There is no bony tenderness and no step-offs.  Neurological: He is alert and oriented to person, place, and time.  Achilles and patellar reflexes are 3+ and equal in the bilateral lower extremities.  Strength is 5 out of 5 in the bilateral lower extremities. He is ambulatory on his heels and toes without difficulty.  Skin: Skin is warm and dry. He is not diaphoretic.    ED Course  Procedures (including critical care time) Labs Review Labs Reviewed - No data to display Imaging Review No results found.  EKG Interpretation   None       MDM  No diagnosis found. Patient presents with what appears to be musculoskeletal back pain. Strength and reflexes are equal and symmetrical. He is  able walk on his heels and toes without difficulty. He will be given anti-inflammatories and pain medication. If not improving in the next week he is to follow up with his primary care Dr. To discuss his situation. There are no red flag is such as bowel or bladder complaints or asymmetrical or absent reflexes that would suggest a more emergent condition such as cauda equina.    Geoffery Lyons, MD 09/13/13 [phone]

## 2014-08-02 ENCOUNTER — Emergency Department (HOSPITAL_COMMUNITY): Payer: Self-pay

## 2014-08-02 ENCOUNTER — Emergency Department (HOSPITAL_COMMUNITY)
Admission: EM | Admit: 2014-08-02 | Discharge: 2014-08-02 | Disposition: A | Discharge status: Home / Self Care | Payer: Self-pay | Attending: Emergency Medicine | Admitting: Emergency Medicine

## 2014-08-02 ENCOUNTER — Encounter (HOSPITAL_COMMUNITY): Payer: Self-pay | Admitting: Emergency Medicine

## 2014-08-02 DIAGNOSIS — Z79899 Other long term (current) drug therapy: Secondary | ICD-10-CM | POA: Insufficient documentation

## 2014-08-02 DIAGNOSIS — Z8639 Personal history of other endocrine, nutritional and metabolic disease: Secondary | ICD-10-CM | POA: Insufficient documentation

## 2014-08-02 DIAGNOSIS — R519 Headache, unspecified: Secondary | ICD-10-CM

## 2014-08-02 DIAGNOSIS — F17200 Nicotine dependence, unspecified, uncomplicated: Secondary | ICD-10-CM | POA: Insufficient documentation

## 2014-08-02 DIAGNOSIS — I10 Essential (primary) hypertension: Secondary | ICD-10-CM | POA: Insufficient documentation

## 2014-08-02 DIAGNOSIS — Z862 Personal history of diseases of the blood and blood-forming organs and certain disorders involving the immune mechanism: Secondary | ICD-10-CM | POA: Insufficient documentation

## 2014-08-02 DIAGNOSIS — R51 Headache: Secondary | ICD-10-CM | POA: Insufficient documentation

## 2014-08-02 LAB — COMPREHENSIVE METABOLIC PANEL
ALK PHOS: 79 U/L (ref 39–117)
ALT: 30 U/L (ref 0–53)
AST: 21 U/L (ref 0–37)
Albumin: 4.1 g/dL (ref 3.5–5.2)
Anion gap: 10 (ref 5–15)
BUN: 7 mg/dL (ref 6–23)
CALCIUM: 9.5 mg/dL (ref 8.4–10.5)
CO2: 27 meq/L (ref 19–32)
Chloride: 103 mEq/L (ref 96–112)
Creatinine, Ser: 0.81 mg/dL (ref 0.50–1.35)
GLUCOSE: 119 mg/dL — AB (ref 70–99)
POTASSIUM: 4.6 meq/L (ref 3.7–5.3)
SODIUM: 140 meq/L (ref 137–147)
Total Bilirubin: 0.6 mg/dL (ref 0.3–1.2)
Total Protein: 7.3 g/dL (ref 6.0–8.3)

## 2014-08-02 LAB — CBC WITH DIFFERENTIAL/PLATELET
Basophils Absolute: 0 10*3/uL (ref 0.0–0.1)
Basophils Relative: 0 % (ref 0–1)
EOS PCT: 7 % — AB (ref 0–5)
Eosinophils Absolute: 0.8 10*3/uL — ABNORMAL HIGH (ref 0.0–0.7)
HEMATOCRIT: 45.3 % (ref 39.0–52.0)
HEMOGLOBIN: 15.1 g/dL (ref 13.0–17.0)
LYMPHS ABS: 2.1 10*3/uL (ref 0.7–4.0)
LYMPHS PCT: 18 % (ref 12–46)
MCH: 30.6 pg (ref 26.0–34.0)
MCHC: 33.3 g/dL (ref 30.0–36.0)
MCV: 91.7 fL (ref 78.0–100.0)
MONOS PCT: 4 % (ref 3–12)
Monocytes Absolute: 0.5 10*3/uL (ref 0.1–1.0)
Neutro Abs: 8.2 10*3/uL — ABNORMAL HIGH (ref 1.7–7.7)
Neutrophils Relative %: 71 % (ref 43–77)
PLATELETS: 216 10*3/uL (ref 150–400)
RBC: 4.94 MIL/uL (ref 4.22–5.81)
RDW: 13.1 % (ref 11.5–15.5)
WBC: 11.6 10*3/uL — AB (ref 4.0–10.5)

## 2014-08-02 MED ORDER — IBUPROFEN 800 MG PO TABS: 800.0000 mg | ORAL_TABLET | Freq: Three times a day (TID) | ORAL | Status: AC

## 2014-08-02 MED ORDER — METOCLOPRAMIDE HCL 5 MG/ML IJ SOLN
10.0000 mg | Freq: Once | INTRAMUSCULAR | Status: AC
  Administered 2014-08-02: 10 mg via INTRAVENOUS
  Filled 2014-08-02: qty 2

## 2014-08-02 MED ORDER — IBUPROFEN 800 MG PO TABS: 800.0000 mg | ORAL_TABLET | Freq: Three times a day (TID) | ORAL | Status: DC

## 2014-08-02 MED ORDER — DIAZEPAM 5 MG PO TABS: 5.0000 mg | ORAL_TABLET | Freq: Two times a day (BID) | ORAL | Status: DC

## 2014-08-02 MED ORDER — DIPHENHYDRAMINE HCL 50 MG/ML IJ SOLN
25.0000 mg | Freq: Once | INTRAMUSCULAR | Status: AC
  Administered 2014-08-02: 25 mg via INTRAVENOUS
  Filled 2014-08-02: qty 1

## 2014-08-02 MED ORDER — DEXAMETHASONE SODIUM PHOSPHATE 10 MG/ML IJ SOLN
10.0000 mg | Freq: Once | INTRAMUSCULAR | Status: AC
  Administered 2014-08-02: 10 mg via INTRAVENOUS
  Filled 2014-08-02: qty 1

## 2014-08-02 MED ORDER — SODIUM CHLORIDE 0.9 % IV BOLUS (SEPSIS)
1000.0000 mL | Freq: Once | INTRAVENOUS | Status: AC
  Administered 2014-08-02: 1000 mL via INTRAVENOUS

## 2014-08-02 NOTE — ED Provider Notes (Signed)
CSN: [id number]     Arrival date & time 08/02/14  1402 History  This chart was scribed for Gerhard Munch, MD by Karle Plumber, ED Scribe. This patient was seen in room [facility] and the patient's care was started at 3:17 PM.  Chief Complaint  Patient presents with  . Headache   Patient is a 38 y.o. male presenting with headaches. The history is provided by the patient. No language interpreter was used.  Headache Associated symptoms: no vomiting    HPI Comments:  Ian Wall is a 38 y.o. obese male who presents to the Emergency Department complaining of intermittent severe HA onset six months ago. He reports the pain begins in his neck and comes up on the side of his head. He reports associated photophobia with the HA. Has been taking Ibuprofen and Tylenol with minimal relief of the pain so he discontinued use of those. [facility] Flexeril. He states this helps his symptoms at times, but did not receive any relief from it today. Pt denies weakness, fever, chills, numbness, leg swelling or abdominal pain. Mother reports pt had headaches as a child. He states he is a smoker.  Past Medical History  Diagnosis Date  . Gout   . Back pain   . Hypertension   . Hypercholesteremia   . Gout    History reviewed. No pertinent past surgical history. History reviewed. No pertinent family history. History  Substance Use Topics  . Smoking status: Current Every Day Smoker -- 1.00 packs/day    Types: Cigarettes  . Smokeless tobacco: Not on file  . Alcohol Use: No    Review of Systems  Constitutional:       Per HPI, otherwise negative  HENT:       Per HPI, otherwise negative  Respiratory:       Per HPI, otherwise negative  Cardiovascular:       Per HPI, otherwise negative  Gastrointestinal: Negative for vomiting.  Endocrine:       Negative aside from HPI  Genitourinary:       Neg aside from HPI   Musculoskeletal:       Per  HPI, otherwise negative  Skin: Negative.   Neurological: Positive for headaches. Negative for syncope.    Allergies  Review of patient's allergies indicates no known allergies.  Home Medications   Prior to Admission medications   Medication Sig Start Date End Date Taking? Authorizing Provider  cyclobenzaprine (FLEXERIL) 10 MG tablet Take 10 mg by mouth 3 (three) times daily as needed for muscle spasms.   Yes Historical Provider, MD  ibuprofen (ADVIL,MOTRIN) 200 MG tablet Take 400-800 mg by mouth every 6 (six) hours as needed. Pain    Historical Provider, MD   Triage Vitals: BP 162/101  Pulse 75  Temp(Src) 98 F (36.7 C) (Oral)  Resp 16  Ht  (1.676 m)  Wt 250 lb (113.399 kg)  BMI 40.37 kg/m2  SpO2 97% Physical Exam  Nursing note and vitals reviewed. Constitutional: He is oriented to person, place, and time. He appears well-developed. No distress.  HENT:  Head: Normocephalic and atraumatic.  Eyes: Conjunctivae and EOM are normal. Pupils are equal, round, and reactive to light.  Midrange pupils bilaterally.  Neck:  Negative Kernig's sign.  Cardiovascular: Normal rate, regular rhythm and normal heart sounds.  Exam reveals no gallop and no friction rub.   No murmur heard. Pulmonary/Chest: Effort normal and breath sounds  normal. No stridor. No respiratory distress. He has no wheezes. He has no rales.  Abdominal: He exhibits no distension.  Musculoskeletal: He exhibits no edema.  Neurological: He is alert and oriented to person, place, and time.  Skin: Skin is warm and dry.  Psychiatric: He has a normal mood and affect.    ED Course  Procedures (including critical care time) DIAGNOSTIC STUDIES: Oxygen Saturation is 97% on RA, normal by my interpretation.   COORDINATION OF CARE: 3:21 PM- Will order fluids, lab work, pain medication and CT head. Pt verbalizes understanding and agrees to plan.  Medications  sodium chloride 0.9 % bolus 1,000 mL (not administered)   diphenhydrAMINE (BENADRYL) injection 25 mg (not administered)  metoCLOPramide (REGLAN) injection 10 mg (not administered)  dexamethasone (DECADRON) injection 10 mg (not administered)    Labs Review Labs Reviewed  CBC WITH DIFFERENTIAL - Abnormal; Notable for the following:    WBC 11.6 (*)    Neutro Abs 8.2 (*)    Eosinophils Relative 7 (*)    Eosinophils Absolute 0.8 (*)    All other components within normal limits  COMPREHENSIVE METABOLIC PANEL - Abnormal; Notable for the following:    Glucose, Bld 119 (*)    All other components within normal limits    Imaging Review Ct Head Wo Contrast  08/02/2014   CLINICAL DATA:  Mental status changes and headache.  EXAM: CT HEAD WITHOUT CONTRAST  TECHNIQUE: Contiguous axial images were obtained from the base of the skull through the vertex without intravenous contrast.  COMPARISON:  None.  FINDINGS: The brain demonstrates no evidence of hemorrhage, infarction, edema, mass effect, extra-axial fluid collection, hydrocephalus or mass lesion. The skull is unremarkable.  IMPRESSION: Normal head CT.   Electronically Signed   By: Irish Lack M.D.   On: 08/02/2014 16:26     EKG Interpretation None     5:28 PM Patient awake alert, ambulatory, in no distress. No headache, though there is mild neck tightness.  MDM   Patient presents with ongoing headache for months her Patient has no neurologic deficits, no evidence of distress, is hemodynamically stable aside from mild hypertension. Patient's headache resolves here. Low suspicion for intracranial pathology given the reassuring CT scan, absence of neurologic findings. Similarly, absent fever, chills, overt stigmata of infection, there is low suspicion for meningitis. Patient was discharged in stable condition to follow up with neurology.  I personally performed the services described in this documentation, which was scribed in my presence. The recorded information has been reviewed and is  accurate.    Gerhard Munch, MD 08/02/14 1729

## 2014-08-02 NOTE — Discharge Instructions (Signed)
As discussed, your evaluation is largely reassuring.  Her headache is likely due to either tension or new migraine. Is important that you follow up with our neurology colleagues for further evaluation and management.  Return here for further evaluation and management

## 2014-08-02 NOTE — ED Notes (Signed)
Headaches x 6 months, usually awakens with them and they last all day.  Sometimes Flexeril helps, but otherwise nothing seems to help. Pain is frontal and occipital w/radiation down cervical spine. Has HTN and has been off meds x 1 month d/t cost.

## 2015-09-16 ENCOUNTER — Emergency Department (HOSPITAL_COMMUNITY)
Admission: EM | Admit: 2015-09-16 | Discharge: 2015-09-16 | Disposition: A | Discharge status: Home / Self Care | Payer: Self-pay | Attending: Emergency Medicine | Admitting: Emergency Medicine

## 2015-09-16 ENCOUNTER — Encounter (HOSPITAL_COMMUNITY): Payer: Self-pay

## 2015-09-16 DIAGNOSIS — Z8639 Personal history of other endocrine, nutritional and metabolic disease: Secondary | ICD-10-CM | POA: Insufficient documentation

## 2015-09-16 DIAGNOSIS — M545 Low back pain: Secondary | ICD-10-CM | POA: Insufficient documentation

## 2015-09-16 DIAGNOSIS — M25511 Pain in right shoulder: Secondary | ICD-10-CM | POA: Insufficient documentation

## 2015-09-16 DIAGNOSIS — M542 Cervicalgia: Secondary | ICD-10-CM | POA: Insufficient documentation

## 2015-09-16 DIAGNOSIS — Z720 Tobacco use: Secondary | ICD-10-CM | POA: Insufficient documentation

## 2015-09-16 DIAGNOSIS — Z79899 Other long term (current) drug therapy: Secondary | ICD-10-CM | POA: Insufficient documentation

## 2015-09-16 DIAGNOSIS — K0889 Other specified disorders of teeth and supporting structures: Secondary | ICD-10-CM | POA: Insufficient documentation

## 2015-09-16 DIAGNOSIS — I10 Essential (primary) hypertension: Secondary | ICD-10-CM | POA: Insufficient documentation

## 2015-09-16 DIAGNOSIS — K029 Dental caries, unspecified: Secondary | ICD-10-CM

## 2015-09-16 MED ORDER — ONDANSETRON HCL 4 MG PO TABS
4.0000 mg | ORAL_TABLET | Freq: Once | ORAL | Status: AC
  Administered 2015-09-16: 4 mg via ORAL
  Filled 2015-09-16: qty 1

## 2015-09-16 MED ORDER — TRAMADOL HCL 50 MG PO TABS: ORAL_TABLET | ORAL | Status: DC

## 2015-09-16 MED ORDER — AMOXICILLIN 250 MG PO CAPS
500.0000 mg | ORAL_CAPSULE | Freq: Once | ORAL | Status: AC
  Administered 2015-09-16: 500 mg via ORAL
  Filled 2015-09-16: qty 2

## 2015-09-16 MED ORDER — AMOXICILLIN 500 MG PO CAPS: 500.0000 mg | ORAL_CAPSULE | Freq: Three times a day (TID) | ORAL | Status: DC

## 2015-09-16 MED ORDER — TRAMADOL HCL 50 MG PO TABS
100.0000 mg | ORAL_TABLET | Freq: Once | ORAL | Status: AC
  Administered 2015-09-16: 100 mg via ORAL
  Filled 2015-09-16: qty 2

## 2015-09-16 MED ORDER — IBUPROFEN 600 MG PO TABS: 600.0000 mg | ORAL_TABLET | Freq: Four times a day (QID) | ORAL | Status: DC | PRN

## 2015-09-16 MED ORDER — IBUPROFEN 800 MG PO TABS
800.0000 mg | ORAL_TABLET | Freq: Once | ORAL | Status: AC
  Administered 2015-09-16: 800 mg via ORAL
  Filled 2015-09-16: qty 1

## 2015-09-16 NOTE — ED Notes (Signed)
Pt complain of right shoulder pain, headache and toothache

## 2015-09-16 NOTE — ED Provider Notes (Signed)
CSN: [id number]     Arrival date & time 09/16/15  0806 History   First MD Initiated Contact with Patient 09/16/15 [phone]     Chief Complaint  Patient presents with  . Shoulder Pain     (Consider location/radiation/quality/duration/timing/severity/associated sxs/prior Treatment) Patient is a 39 y.o. male presenting with tooth pain.  Dental Pain Location:  Lower Lower teeth location:  17/LL 3rd molar Quality:  Throbbing and sharp Severity:  Moderate Onset quality:  Gradual Duration:  3 days Timing:  Intermittent Progression:  Worsening Context: dental caries   Context: not recent dental surgery and not trauma   Relieved by:  Nothing Worsened by:  Cold food/drink Ineffective treatments:  NSAIDs Associated symptoms: no drooling, no facial swelling, no fever and no trismus   Risk factors: lack of dental care and smoking   Risk factors: no chewing tobacco use and no diabetes     Past Medical History  Diagnosis Date  . Gout   . Back pain   . Hypertension   . Hypercholesteremia   . Gout    History reviewed. No pertinent past surgical history. No family history on file. Social History  Substance Use Topics  . Smoking status: Current Every Day Smoker -- 1.00 packs/day    Types: Cigarettes  . Smokeless tobacco: None  . Alcohol Use: Yes    Review of Systems  Constitutional: Negative for fever.  HENT: Positive for dental problem. Negative for drooling and facial swelling.   Musculoskeletal: Positive for back pain and arthralgias.  All other systems reviewed and are negative.     Allergies  Review of patient's allergies indicates no known allergies.  Home Medications   Prior to Admission medications   Medication Sig Start Date End Date Taking? Authorizing Provider  diazepam (VALIUM) 5 MG tablet Take 1 tablet (5 mg total) by mouth 2 (two) times daily. 08/02/14   Gerhard Munch, MD   BP 160/90 mmHg  Pulse 75  Temp(Src) 97.6 F (36.4 C)  Resp 18  Ht  (1.676 m)   Wt 245 lb (111.131 kg)  BMI 39.56 kg/m2  SpO2 100% Physical Exam  Constitutional: He is oriented to person, place, and time. He appears well-developed and well-nourished.  Non-toxic appearance.  HENT:  Head: Normocephalic.  Right Ear: Tympanic membrane and external ear normal.  Left Ear: Tympanic membrane and external ear normal.  The right lower posterior molar is decayed below the gum line. No visible abscess. Airway patent. No swelling under the tongue.  Eyes: EOM and lids are normal. Pupils are equal, round, and reactive to light.  Neck: Normal range of motion. Neck supple. Carotid bruit is not present.  Cardiovascular: Normal rate, regular rhythm, normal heart sounds, intact distal pulses and normal pulses.   Pulmonary/Chest: Breath sounds normal. No respiratory distress.  Abdominal: Soft. Bowel sounds are normal. There is no tenderness. There is no guarding.  Musculoskeletal: Normal range of motion.  Soreness with ROM of the neck and right shoulder. No evidence for dislocation.  Lymphadenopathy:       Head (right side): No submandibular adenopathy present.       Head (left side): No submandibular adenopathy present.    He has no cervical adenopathy.  Neurological: He is alert and oriented to person, place, and time. He has normal strength. No cranial nerve deficit or sensory deficit.  Skin: Skin is warm and dry.  Psychiatric: He has a normal mood and affect. His speech is normal.  Nursing note and vitals  reviewed.   ED Course  Procedures (including critical care time) Labs Review Labs Reviewed - No data to display  Imaging Review No results found. I have personally reviewed and evaluated these images and lab results as part of my medical decision-making.   EKG Interpretation None      MDM  Vital signs reviewed. Pt has multiple dental caries. No abscess present. No evidence for Ludwig's angina.  He c/o chronic shoulder and neck discomfort.  Rx for amoxil ,  ibuprofen, and ultram given. [facility]. He is encouraged to see a dentist as soon as possible.   Final diagnoses:  None    *I have reviewed nursing notes, vital signs, and all appropriate lab and imaging results for this patient.[address]**    Chianne Byrns, PA-C 09/16/15 [id number]  Glynn OctaveStephen Rancour, MD 09/16/15 [phone]

## 2015-09-16 NOTE — ED Notes (Signed)
Patient given water per RN approval. 

## 2015-09-16 NOTE — Discharge Instructions (Signed)
Your blood pressure is 160/90, otherwise your vital signs are within normal limits. You have some swelling around the right lower molar, but no abscess visible at this time. Please use the amoxil and ibuprofen daily. See a dentist as soon as possible. Please see one of the orthopedic surgeons concerning your shoulder and neck pain. Dental Caries Dental caries (also called tooth decay) is the most common oral disease. It can occur at any age but is more common in children and young adults.  HOW DENTAL CARIES DEVELOPS  The process of decay begins when bacteria and foods (particularly sugars and starches) combine in your mouth to produce plaque. Plaque is a substance that sticks to the hard, outer surface of a tooth (enamel). The bacteria in plaque produce acids that attack enamel. These acids may also attack the root surface of a tooth (cementum) if it is exposed. Repeated attacks dissolve these surfaces and create holes in the tooth (cavities). If left untreated, the acids destroy the other layers of the tooth.  RISK FACTORS  Frequent sipping of sugary beverages.   Frequent snacking on sugary and starchy foods, especially those that easily get stuck in the teeth.   Poor oral hygiene.   Dry mouth.   Substance abuse such as methamphetamine abuse.   Broken or poor-fitting dental restorations.   Eating disorders.   Gastroesophageal reflux disease (GERD).   Certain radiation treatments to the head and neck. SYMPTOMS In the early stages of dental caries, symptoms are seldom present. Sometimes white, chalky areas may be seen on the enamel or other tooth layers. In later stages, symptoms may include:  Pits and holes on the enamel.  Toothache after sweet, hot, or cold foods or drinks are consumed.  Pain around the tooth.  Swelling around the tooth. DIAGNOSIS  Most of the time, dental caries is detected during a regular dental checkup. A diagnosis is made after a thorough medical and  dental history is taken and the surfaces of your teeth are checked for signs of dental caries. Sometimes special instruments, such as lasers, are used to check for dental caries. Dental X-ray exams may be taken so that areas not visible to the eye (such as between the contact areas of the teeth) can be checked for cavities.  TREATMENT  If dental caries is in its early stages, it may be reversed with a fluoride treatment or an application of a remineralizing agent at the dental office. Thorough brushing and flossing at home is needed to aid these treatments. If it is in its later stages, treatment depends on the location and extent of tooth destruction:   If a small area of the tooth has been destroyed, the destroyed area will be removed and cavities will be filled with a material such as gold, silver amalgam, or composite resin.   If a large area of the tooth has been destroyed, the destroyed area will be removed and a cap (crown) will be fitted over the remaining tooth structure.   If the center part of the tooth (pulp) is affected, a procedure called a root canal will be needed before a filling or crown can be placed.   If most of the tooth has been destroyed, the tooth may need to be pulled (extracted). HOME CARE INSTRUCTIONS You can prevent, stop, or reverse dental caries at home by practicing good oral hygiene. Good oral hygiene includes:  Thoroughly cleaning your teeth at least twice a day with a toothbrush and dental floss.  Using a fluoride toothpaste. A fluoride mouth rinse may also be used if recommended by your dentist or health care provider.   Restricting the amount of sugary and starchy foods and sugary liquids you consume.   Avoiding frequent snacking on these foods and sipping of these liquids.   Keeping regular visits with a dentist for checkups and cleanings. PREVENTION   Practice good oral hygiene.  Consider a dental sealant. A dental sealant is a coating  material that is applied by your dentist to the pits and grooves of teeth. The sealant prevents food from being trapped in them. It may protect the teeth for several years.  Ask about fluoride supplements if you live in a community without fluorinated water or with water that has a low fluoride content. Use fluoride supplements as directed by your dentist or health care provider.  Allow fluoride varnish applications to teeth if directed by your dentist or health care provider.   This information is not intended to replace advice given to you by your health care provider. Make sure you discuss any questions you have with your health care provider.   Document Released: 08/07/2002 Document Revised: 12/06/2014 Document Reviewed: 11/17/2012 Elsevier Interactive Patient Education [employer]2016 [employer]

## 2015-10-13 ENCOUNTER — Emergency Department (HOSPITAL_COMMUNITY)
Admission: EM | Admit: 2015-10-13 | Discharge: 2015-10-13 | Disposition: A | Discharge status: Home / Self Care | Payer: Self-pay | Attending: Emergency Medicine | Admitting: Emergency Medicine

## 2015-10-13 ENCOUNTER — Encounter (HOSPITAL_COMMUNITY): Payer: Self-pay | Admitting: Emergency Medicine

## 2015-10-13 DIAGNOSIS — Z8639 Personal history of other endocrine, nutritional and metabolic disease: Secondary | ICD-10-CM | POA: Insufficient documentation

## 2015-10-13 DIAGNOSIS — M109 Gout, unspecified: Secondary | ICD-10-CM | POA: Insufficient documentation

## 2015-10-13 DIAGNOSIS — I10 Essential (primary) hypertension: Secondary | ICD-10-CM | POA: Insufficient documentation

## 2015-10-13 DIAGNOSIS — G5603 Carpal tunnel syndrome, bilateral upper limbs: Secondary | ICD-10-CM | POA: Insufficient documentation

## 2015-10-13 DIAGNOSIS — Z862 Personal history of diseases of the blood and blood-forming organs and certain disorders involving the immune mechanism: Secondary | ICD-10-CM | POA: Insufficient documentation

## 2015-10-13 DIAGNOSIS — F17210 Nicotine dependence, cigarettes, uncomplicated: Secondary | ICD-10-CM | POA: Insufficient documentation

## 2015-10-13 MED ORDER — IBUPROFEN 800 MG PO TABS
800.0000 mg | ORAL_TABLET | Freq: Once | ORAL | Status: AC
  Administered 2015-10-13: 800 mg via ORAL
  Filled 2015-10-13: qty 1

## 2015-10-13 MED ORDER — IBUPROFEN 800 MG PO TABS: 800.0000 mg | ORAL_TABLET | Freq: Three times a day (TID) | ORAL | Status: DC | PRN

## 2015-10-13 NOTE — ED Notes (Signed)
Pt states right hand has hand "some swelling and tingling" for about 2months and last night or "yesterday" the left hand began feeling the same way. No known injury or illness.

## 2015-10-13 NOTE — ED Provider Notes (Signed)
TIME SEEN: 6:30 AM  CHIEF COMPLAINT: Bilateral hand pain, tingling  HPI: Pt is a 39 y.o. male with history of hypertension, hyperlipidemia, gout who presents to the emergency department with 2 months of right hand swelling, tingling in the first 4 digits worse at night. States that 2 days ago left hand began feeling the same. No known injury. He works as an Personnel officerelectrician. No neck or back pain. No focal weakness. No bowel or bladder incontinence. No difficulty walking. Father with history of carpal tunnel. No previous history of PE or DVT.  ROS: See HPI Constitutional: no fever  Eyes: no drainage  ENT: no runny nose   Cardiovascular:  no chest pain  Resp: no SOB  GI: no vomiting GU: no dysuria Integumentary: no rash  Allergy: no hives  Musculoskeletal: no leg swelling  Neurological: no slurred speech ROS otherwise negative  PAST MEDICAL HISTORY/PAST SURGICAL HISTORY:  Past Medical History  Diagnosis Date  . Gout   . Back pain   . Hypertension   . Hypercholesteremia   . Gout     MEDICATIONS:  Prior to Admission medications   Medication Sig Start Date End Date Taking? Authorizing Provider  amoxicillin (AMOXIL) 500 MG capsule Take 1 capsule (500 mg total) by mouth 3 (three) times daily. 09/16/15   Ivery QualeHobson Bryant, PA-C  benzocaine (ORAJEL) 10 % mucosal gel Use as directed 1 application in the mouth or throat as needed for mouth pain.    Historical Provider, MD  ibuprofen (ADVIL,MOTRIN) 600 MG tablet Take 1 tablet (600 mg total) by mouth every 6 (six) hours as needed. 09/16/15   Ivery QualeHobson Bryant, PA-C  traMADol Janean Sark(ULTRAM) 50 MG tablet 1 or 2 po q6h prn pain 09/16/15   Ivery QualeHobson Bryant, PA-C    ALLERGIES:  No Known Allergies  SOCIAL HISTORY:  Social History  Substance Use Topics  . Smoking status: Current Every Day Smoker -- 1.00 packs/day    Types: Cigarettes  . Smokeless tobacco: Not on file  . Alcohol Use: Yes    FAMILY HISTORY: History reviewed. No pertinent family  history.  EXAM: BP 149/96 mmHg  Pulse 95  Temp(Src) 98.1 F (36.7 C) (Oral)  Resp 18  Ht 5\' 6"  (1.676 m)  Wt 245 lb (111.131 kg)  BMI 39.56 kg/m2  SpO2 99% CONSTITUTIONAL: Alert and oriented and responds appropriately to questions. Well-appearing; well-nourished HEAD: Normocephalic EYES: Conjunctivae clear, PERRL ENT: normal nose; no rhinorrhea; moist mucous membranes; pharynx without lesions noted NECK: Supple, no meningismus, no LAD  CARD: RRR; S1 and S2 appreciated; no murmurs, no clicks, no rubs, no gallops RESP: Normal chest excursion without splinting or tachypnea; breath sounds clear and equal bilaterally; no wheezes, no rhonchi, no rales, no hypoxia or respiratory distress, speaking full sentences ABD/GI: Normal bowel sounds; non-distended; soft, non-tender, no rebound, no guarding, no peritoneal signs BACK:  The back appears normal and is non-tender to palpation, there is no CVA tenderness EXT: Normal sensation diffusely, no obvious swelling of the bilateral hands, no erythema or warmth, no joint effusion, 2+ radial pulses bilaterally, positive phalen's, negative tinel's, Normal ROM in all joints; non-tender to palpation; no edema; normal capillary refill; no cyanosis, no calf tenderness or swelling    SKIN: Normal color for age and race; warm NEURO: Moves all extremities equally, sensation to light touch intact diffusely, cranial nerves II through XII intact, normal gait PSYCH: The patient's mood and manner are appropriate. Grooming and personal hygiene are appropriate.  MEDICAL DECISION MAKING: Patient here with  carpal tunnel syndrome. No sign of septic arthritis on exam or gout. No history of injury to suggest fracture. No bony tenderness. He is neurovascularly intact currently. No neck or back pain, fever to suggest that this is radiculopathy, spinal stenosis, discitis, transverse myelitis, epidural abscess or hematoma. No obvious swelling on exam to suggest DVT. No history of  any recent procedures, line placement in his arms. He has good equal grip strength bilaterally. We'll discharge with anti-inflammatories and outpatient orthopedic follow-up and medical management does not help. Have recommended rest, ice, elevation.  Have also given him bilateral wrist splints. Discussed return precautions. He verbalizes understanding and is comfortable with this plan.    Layla Maw Porscha Axley, DO 10/13/15 [phone]

## 2015-10-13 NOTE — Discharge Instructions (Signed)
RICE for Routine Care of Injuries °The routine care of many injuries includes rest, ice, compression, and elevation (RICE therapy). RICE therapy is often recommended for injuries to soft tissues, such as a muscle strain, ligament injuries, bruises, and overuse injuries. It can also be used for some bony injuries. Using RICE therapy can help to relieve pain, lessen swelling, and enable your body to heal. °Rest °Rest is required to allow your body to heal. This usually involves reducing your normal activities and avoiding use of the injured part of your body. Generally, you can return to your normal activities when you are comfortable and have been given permission by your health care provider. °Ice °Icing your injury helps to keep the swelling down, and it lessens pain. Do not apply ice directly to your skin. °· Put ice in a plastic bag. °· Place a towel between your skin and the bag. °· Leave the ice on for 20 minutes, 2-3 times a day. °Do this for as long as you are directed by your health care provider. °Compression °Compression means putting pressure on the injured area. Compression helps to keep swelling down, gives support, and helps with discomfort. Compression may be done with an elastic bandage. If an elastic bandage has been applied, follow these general tips: °· Remove and reapply the bandage every 3-4 hours or as directed by your health care provider. °· Make sure the bandage is not wrapped too tightly, because this can cut off circulation. If part of your body beyond the bandage becomes blue, numb, cold, swollen, or more painful, your bandage is most likely too tight. If this occurs, remove your bandage and reapply it more loosely. °· See your health care provider if the bandage seems to be making your problems worse rather than better. °Elevation °Elevation means keeping the injured area raised. This helps to lessen swelling and decrease pain. If possible, your injured area should be elevated at or  above the level of your heart or the center of your chest. °WHEN SHOULD I SEEK MEDICAL CARE? °You should seek medical care if: °· Your pain and swelling continue. °· Your symptoms are getting worse rather than improving. °These symptoms may indicate that further evaluation or further X-rays are needed. Sometimes, X-rays may not show a small broken bone (fracture) until a number of days later. Make a follow-up appointment with your health care provider. °WHEN SHOULD I SEEK IMMEDIATE MEDICAL CARE? °You should seek immediate medical care if: °· You have sudden severe pain at or below the area of your injury. °· You have redness or increased swelling around your injury. °· You have tingling or numbness at or below the area of your injury that does not improve after you remove the elastic bandage. °  °This information is not intended to replace advice given to you by your health care provider. Make sure you discuss any questions you have with your health care provider. °  °Document Released: 02/27/2001 Document Revised: 08/06/2015 Document Reviewed: 10/23/2014 °Elsevier Interactive Patient Education ©2016 [employer] °Carpal Tunnel Syndrome °Carpal tunnel syndrome is a condition that causes pain in your hand and arm. The carpal tunnel is a narrow area located on the palm side of your wrist. Repeated wrist motion or certain diseases may cause swelling within the tunnel. This swelling pinches the main nerve in the wrist (median nerve). °CAUSES  °This condition may be caused by:  °· Repeated wrist motions. °· Wrist injuries. °· Arthritis. °· A cyst or tumor in the carpal tunnel. °· Fluid buildup   during pregnancy. °Sometimes the cause of this condition is not known.  °RISK FACTORS °This condition is more likely to develop in:  °· People who have jobs that cause them to repeatedly move their wrists in the same motion, such as butchers and cashiers. °· Women. °· People with certain conditions, such  as: °· Diabetes. °· Obesity. °· An underactive thyroid (hypothyroidism). °· Kidney failure. °SYMPTOMS  °Symptoms of this condition include:  °· A tingling feeling in your fingers, especially in your thumb, index, and middle fingers. °· Tingling or numbness in your hand. °· An aching feeling in your entire arm, especially when your wrist and elbow are bent for long periods of time. °· Wrist pain that goes up your arm to your shoulder. °· Pain that goes down into your palm or fingers. °· A weak feeling in your hands. You may have trouble grabbing and holding items. °Your symptoms may feel worse during the night.  °DIAGNOSIS  °This condition is diagnosed with a medical history and physical exam. You may also have tests, including:  °· An electromyogram (EMG). This test measures electrical signals sent by your nerves into the muscles. °· X-rays. °TREATMENT  °Treatment for this condition includes: °· Lifestyle changes. It is important to stop doing or modify the activity that caused your condition. °· Physical or occupational therapy. °· Medicines for pain and inflammation. This may include medicine that is injected into your wrist. °· A wrist splint. °· Surgery. °HOME CARE INSTRUCTIONS  °If You Have a Splint: °· Wear it as told by your health care provider. Remove it only as told by your health care provider. °· Loosen the splint if your fingers become numb and tingle, or if they turn cold and blue. °· Keep the splint clean and dry. °General Instructions °· Take over-the-counter and prescription medicines only as told by your health care provider. °· Rest your wrist from any activity that may be causing your pain. If your condition is work related, talk to your employer about changes that can be made, such as getting a wrist pad to use while typing. °· If directed, apply ice to the painful area: °¨ Put ice in a plastic bag. °¨ Place a towel between your skin and the bag. °¨ Leave the ice on for 20 minutes, 2-3 times per  day. °· Keep all follow-up visits as told by your health care provider. This is important. °· Do any exercises as told by your health care provider, physical therapist, or occupational therapist. °SEEK MEDICAL CARE IF:  °· You have new symptoms. °· Your pain is not controlled with medicines. °· Your symptoms get worse. °  °This information is not intended to replace advice given to you by your health care provider. Make sure you discuss any questions you have with your health care provider. °  °Document Released: 11/12/2000 Document Revised: 08/06/2015 Document Reviewed: 04/02/2015 °Elsevier Interactive Patient Education ©2016 [employer] Inc. ° °

## 2015-10-27 ENCOUNTER — Ambulatory Visit: Payer: Self-pay | Admitting: Physician Assistant

## 2015-10-27 ENCOUNTER — Encounter: Payer: Self-pay | Admitting: Physician Assistant

## 2015-10-27 VITALS — BP 152/96 | HR 82 | Temp 99.0°F | Ht 66.5 in | Wt 234.4 lb

## 2015-10-27 DIAGNOSIS — F17210 Nicotine dependence, cigarettes, uncomplicated: Secondary | ICD-10-CM

## 2015-10-27 DIAGNOSIS — E669 Obesity, unspecified: Secondary | ICD-10-CM

## 2015-10-27 DIAGNOSIS — M25511 Pain in right shoulder: Secondary | ICD-10-CM

## 2015-10-27 DIAGNOSIS — E785 Hyperlipidemia, unspecified: Secondary | ICD-10-CM

## 2015-10-27 DIAGNOSIS — I10 Essential (primary) hypertension: Secondary | ICD-10-CM | POA: Insufficient documentation

## 2015-10-27 DIAGNOSIS — Z131 Encounter for screening for diabetes mellitus: Secondary | ICD-10-CM

## 2015-10-27 LAB — GLUCOSE, POCT (MANUAL RESULT ENTRY): POC Glucose: 101 mg/dl — AB (ref 70–99)

## 2015-10-27 MED ORDER — LISINOPRIL 10 MG PO TABS: 10.0000 mg | ORAL_TABLET | Freq: Every day | ORAL | Status: DC

## 2015-10-27 NOTE — Progress Notes (Signed)
BP 152/96 mmHg  Pulse 82  Temp(Src) 99 F (37.2 C)  Ht 5' 6.5" (1.689 m)  Wt 234 lb 6.4 oz (106.323 kg)  BMI 37.27 kg/m2  SpO2 97%   Subjective:    Patient ID: Ian Wall, male    DOB: 05-Mar-1976, 39 y.o.   MRN: [id number]  HPI: Ian Wall is a 39 y.o. male presenting on 10/27/2015 for New Patient (Initial Visit)   HPI   Pt here today c/o shoulder pain and  B carpal tunnel.   went to ortho approx 2009- was told had a crack in it.  He says then was okay for about five years.  Says it started hurting him again about a year ago. Pt states last time he had insurance was about 2009.  Pt states [facility] "[facility]" [facility] (which has only GPs in residence).  Pt was on bp and chol meds in the past  Pt's mother is with him today  Relevant past medical, surgical, family and social history reviewed and updated as indicated. Interim medical history since our last visit reviewed. Allergies and medications reviewed and updated.  No current outpatient prescriptions on file.   Review of Systems  Constitutional: Negative for fever, chills, diaphoresis, appetite change, fatigue and unexpected weight change.  HENT: Positive for dental problem. Negative for congestion, drooling, ear pain, facial swelling, hearing loss, mouth sores, sneezing, sore throat, trouble swallowing and voice change.   Eyes: Negative for pain, discharge, redness, itching and visual disturbance.  Respiratory: Negative for cough, choking, shortness of breath and wheezing.   Cardiovascular: Negative for chest pain, palpitations and leg swelling.  Gastrointestinal: Negative for vomiting, abdominal pain, diarrhea, constipation and blood in stool.  Endocrine: Negative for cold intolerance, heat intolerance and polydipsia.  Genitourinary: Negative for dysuria, hematuria and decreased urine volume.  Musculoskeletal: Positive for arthralgias. Negative for back pain and gait problem.   Skin: Negative for rash.  Allergic/Immunologic: Negative for environmental allergies.  Neurological: Positive for headaches. Negative for seizures, syncope and light-headedness.  Hematological: Negative for adenopathy.  Psychiatric/Behavioral: Negative for suicidal ideas, dysphoric mood and agitation. The patient is not nervous/anxious.     Per HPI unless specifically indicated above     Objective:    BP 152/96 mmHg  Pulse 82  Temp(Src) 99 F (37.2 C)  Ht 5' 6.5" (1.689 m)  Wt 234 lb 6.4 oz (106.323 kg)  BMI 37.27 kg/m2  SpO2 97%  Wt Readings from Last 3 Encounters:  10/27/15 234 lb 6.4 oz (106.323 kg)  10/13/15 245 lb (111.131 kg)  09/16/15 245 lb (111.131 kg)    Physical Exam  Constitutional: He is oriented to person, place, and time. He appears well-developed and well-nourished.  HENT:  Head: Normocephalic and atraumatic.  Mouth/Throat: Oropharynx is clear and moist. No oropharyngeal exudate.  Eyes: Conjunctivae and EOM are normal. Pupils are equal, round, and reactive to light.  Neck: Neck supple. No thyromegaly present.  Cardiovascular: Normal rate and regular rhythm.   Pulmonary/Chest: Effort normal and breath sounds normal. He has no wheezes. He has no rales.  Concave indentations several inches in diameter under each nipple with scarring  Abdominal: Soft. Bowel sounds are normal. He exhibits no mass. There is no tenderness.  obese  Musculoskeletal: He exhibits no edema.       Right shoulder: He exhibits tenderness (mild generalized tenderness), pain (painful to push against resistance) and decreased strength. He exhibits normal range of  motion, no bony tenderness, no swelling, no effusion and no deformity.  Lymphadenopathy:    He has no cervical adenopathy.  Neurological: He is alert and oriented to person, place, and time.  Skin: Skin is warm and dry. No rash noted.  Psychiatric: He has a normal mood and affect. His behavior is normal. Thought content normal.   Vitals reviewed.       Assessment & Plan:   Encounter Diagnoses  Name Primary?  . Essential hypertension Yes  . Hyperlipemia   . Pain in joint of right shoulder   . Screening for diabetes mellitus   . Cigarette nicotine dependence, uncomplicated   . Obesity, unspecified     -pt will have baseline labs drawn -rx lisinopril for bp -pt will get xray of the shoulder.  -pt is given [employer] discount application that he needs to fill out and turn in -f/u 1 mo. rto sooner prn

## 2015-10-31 ENCOUNTER — Ambulatory Visit (HOSPITAL_COMMUNITY)
Admission: RE | Admit: 2015-10-31 | Discharge: 2015-10-31 | Disposition: A | Discharge status: Home / Self Care | Payer: Self-pay | Source: Ambulatory Visit | Attending: Physician Assistant | Admitting: Physician Assistant

## 2015-10-31 DIAGNOSIS — M25511 Pain in right shoulder: Secondary | ICD-10-CM | POA: Insufficient documentation

## 2015-10-31 LAB — CBC WITH DIFFERENTIAL/PLATELET
Basophils Absolute: 0 10*3/uL (ref 0.0–0.1)
Basophils Relative: 0 % (ref 0–1)
EOS ABS: 1.1 10*3/uL — AB (ref 0.0–0.7)
EOS PCT: 10 % — AB (ref 0–5)
HCT: 45.6 % (ref 39.0–52.0)
Hemoglobin: 15.2 g/dL (ref 13.0–17.0)
LYMPHS ABS: 3.1 10*3/uL (ref 0.7–4.0)
Lymphocytes Relative: 28 % (ref 12–46)
MCH: 29.6 pg (ref 26.0–34.0)
MCHC: 33.3 g/dL (ref 30.0–36.0)
MCV: 88.7 fL (ref 78.0–100.0)
MPV: 11.2 fL (ref 8.6–12.4)
Monocytes Absolute: 0.7 10*3/uL (ref 0.1–1.0)
Monocytes Relative: 6 % (ref 3–12)
NEUTROS PCT: 56 % (ref 43–77)
Neutro Abs: 6.2 10*3/uL (ref 1.7–7.7)
PLATELETS: 219 10*3/uL (ref 150–400)
RBC: 5.14 MIL/uL (ref 4.22–5.81)
RDW: 13.8 % (ref 11.5–15.5)
WBC: 11.1 10*3/uL — ABNORMAL HIGH (ref 4.0–10.5)

## 2015-10-31 LAB — LIPID PANEL
Cholesterol: 203 mg/dL — ABNORMAL HIGH (ref 125–200)
HDL: 30 mg/dL — ABNORMAL LOW (ref >=40)
LDL Cholesterol: 145 mg/dL — ABNORMAL HIGH (ref <130)
Total CHOL/HDL Ratio: 6.8 Ratio — ABNORMAL HIGH (ref <=5.0)
Triglycerides: 141 mg/dL (ref <150)
VLDL: 28 mg/dL (ref <30)

## 2015-10-31 LAB — COMPLETE METABOLIC PANEL WITH GFR
ALK PHOS: 72 U/L (ref 40–115)
ALT: 16 U/L (ref 9–46)
AST: 14 U/L (ref 10–40)
Albumin: 4.4 g/dL (ref 3.6–5.1)
BILIRUBIN TOTAL: 0.5 mg/dL (ref 0.2–1.2)
BUN: 12 mg/dL (ref 7–25)
CO2: 29 mmol/L (ref 20–31)
CREATININE: 0.96 mg/dL (ref 0.60–1.35)
Calcium: 9.1 mg/dL (ref 8.6–10.3)
Chloride: 106 mmol/L (ref 98–110)
GFR, Est African American: >89 mL/min (ref >=60)
GLUCOSE: 89 mg/dL (ref 65–99)
Potassium: 4.7 mmol/L (ref 3.5–5.3)
SODIUM: 140 mmol/L (ref 135–146)
TOTAL PROTEIN: 6.6 g/dL (ref 6.1–8.1)

## 2015-11-01 LAB — HEMOGLOBIN A1C
Hgb A1c MFr Bld: 5.7 % — ABNORMAL HIGH (ref <5.7)
Mean Plasma Glucose: 117 mg/dL — ABNORMAL HIGH (ref <117)

## 2015-11-01 LAB — TSH: TSH: 0.768 u[IU]/mL (ref 0.350–4.500)

## 2015-12-02 ENCOUNTER — Ambulatory Visit: Payer: Self-pay | Admitting: Physician Assistant

## 2015-12-02 ENCOUNTER — Encounter: Payer: Self-pay | Admitting: Physician Assistant

## 2015-12-02 VITALS — BP 166/82 | HR 96 | Temp 98.1°F | Ht 66.5 in | Wt 230.4 lb

## 2015-12-02 DIAGNOSIS — E785 Hyperlipidemia, unspecified: Secondary | ICD-10-CM

## 2015-12-02 DIAGNOSIS — I10 Essential (primary) hypertension: Secondary | ICD-10-CM

## 2015-12-02 DIAGNOSIS — M25511 Pain in right shoulder: Secondary | ICD-10-CM

## 2015-12-02 DIAGNOSIS — E669 Obesity, unspecified: Secondary | ICD-10-CM

## 2015-12-02 DIAGNOSIS — F17210 Nicotine dependence, cigarettes, uncomplicated: Secondary | ICD-10-CM

## 2015-12-02 MED ORDER — LISINOPRIL 20 MG PO TABS: 20.0000 mg | ORAL_TABLET | Freq: Every day | ORAL | Status: DC

## 2015-12-02 MED ORDER — LOVASTATIN 20 MG PO TABS: 20.0000 mg | ORAL_TABLET | Freq: Every day | ORAL | Status: DC

## 2015-12-02 MED ORDER — MELOXICAM 15 MG PO TABS: 15.0000 mg | ORAL_TABLET | Freq: Every day | ORAL | Status: DC

## 2015-12-02 NOTE — Progress Notes (Signed)
BP 166/82 mmHg  Pulse 96  Temp(Src) 98.1 F (36.7 C)  Ht 5' 6.5" (1.689 m)  Wt 230 lb 6.4 oz (104.509 kg)  BMI 36.63 kg/m2  SpO2 97%   Subjective:    Patient ID: Ian Wall, male    DOB: 1976/03/04, 40 y.o.   MRN: [id number]  HPI: SAKET HELLSTROM is a 40 y.o. male presenting on 12/02/2015 for Hypertension; Hyperlipidemia; and Shoulder Problem   HPI   Chief Complaint  Patient presents with  . Hypertension    pt has not taken BP med, pt states he usually takes it at night  . Hyperlipidemia  . Shoulder Problem   Pt has turned in [employer] discount application.  He hasn't heard yet if he was approved  Pt states shoulder isn't too bad right now.  Pt complains that he Still has carpal tunnel for about 4 months now  Relevant past medical, surgical, family and social history reviewed and updated as indicated. Interim medical history since our last visit reviewed. Allergies and medications reviewed and updated.   Current outpatient prescriptions:  .  IBUPROFEN PO, Take by mouth as needed., Disp: , Rfl:  .  lisinopril (PRINIVIL,ZESTRIL) 10 MG tablet, Take 1 tablet (10 mg total) by mouth daily., Disp: 30 tablet, Rfl: 3   Review of Systems  Constitutional: Negative for fever, chills, diaphoresis, appetite change, fatigue and unexpected weight change.  HENT: Negative for congestion, dental problem, drooling, ear pain, facial swelling, hearing loss, mouth sores, sneezing, sore throat, trouble swallowing and voice change.   Eyes: Negative for pain, discharge, redness, itching and visual disturbance.  Respiratory: Negative for cough, choking, shortness of breath and wheezing.   Cardiovascular: Negative for chest pain, palpitations and leg swelling.  Gastrointestinal: Negative for vomiting, abdominal pain, diarrhea, constipation and blood in stool.  Endocrine: Negative for cold intolerance, heat intolerance and polydipsia.  Genitourinary: Negative for dysuria, hematuria and decreased  urine volume.  Musculoskeletal: Negative for back pain, arthralgias and gait problem.  Skin: Negative for rash.  Allergic/Immunologic: Negative for environmental allergies.  Neurological: Negative for seizures, syncope, light-headedness and headaches.  Hematological: Negative for adenopathy.  Psychiatric/Behavioral: Negative for suicidal ideas, dysphoric mood and agitation. The patient is not nervous/anxious.     Per HPI unless specifically indicated above     Objective:    BP 166/82 mmHg  Pulse 96  Temp(Src) 98.1 F (36.7 C)  Ht 5' 6.5" (1.689 m)  Wt 230 lb 6.4 oz (104.509 kg)  BMI 36.63 kg/m2  SpO2 97%  Wt Readings from Last 3 Encounters:  12/02/15 230 lb 6.4 oz (104.509 kg)  10/27/15 234 lb 6.4 oz (106.323 kg)  10/13/15 245 lb (111.131 kg)    Physical Exam  Constitutional: He is oriented to person, place, and time. He appears well-developed and well-nourished.  HENT:  Head: Normocephalic and atraumatic.  Neck: Neck supple.  Cardiovascular: Normal rate and regular rhythm.   Pulmonary/Chest: Effort normal and breath sounds normal. He has no wheezes.  Abdominal: Soft. Bowel sounds are normal. There is no tenderness.  Musculoskeletal: He exhibits no edema.       Right shoulder: Normal.       Right wrist: Normal.       Left wrist: Normal.  Lymphadenopathy:    He has no cervical adenopathy.  Neurological: He is alert and oriented to person, place, and time.  Skin: Skin is warm and dry.  Psychiatric: He has a normal mood and affect. His behavior is  normal.  Vitals reviewed.   Results for orders placed or performed in visit on 10/27/15  CBC w/Diff/Platelet  Result Value Ref Range   WBC 11.1 (H) 4.0 - 10.5 K/uL   RBC 5.14 4.22 - 5.81 MIL/uL   Hemoglobin 15.2 13.0 - 17.0 g/dL   HCT 45.6 39.0 - 52.0 %   MCV 88.7 78.0 - 100.0 fL   MCH 29.6 26.0 - 34.0 pg   MCHC 33.3 30.0 - 36.0 g/dL   RDW 13.8 11.5 - 15.5 %   Platelets 219 150 - 400 K/uL   MPV 11.2 8.6 - 12.4 fL    Neutrophils Relative % 56 43 - 77 %   Neutro Abs 6.2 1.7 - 7.7 K/uL   Lymphocytes Relative 28 12 - 46 %   Lymphs Abs 3.1 0.7 - 4.0 K/uL   Monocytes Relative 6 3 - 12 %   Monocytes Absolute 0.7 0.1 - 1.0 K/uL   Eosinophils Relative 10 (H) 0 - 5 %   Eosinophils Absolute 1.1 (H) 0.0 - 0.7 K/uL   Basophils Relative 0 0 - 1 %   Basophils Absolute 0.0 0.0 - 0.1 K/uL   Smear Review Criteria for review not met   Lipid Profile  Result Value Ref Range   Cholesterol 203 (H) 125 - 200 mg/dL   Triglycerides 141 <150 mg/dL   HDL 30 (L) >=40 mg/dL   Total CHOL/HDL Ratio 6.8 (H) <=5.0 Ratio   VLDL 28 <30 mg/dL   LDL Cholesterol 145 (H) <130 mg/dL  COMPLETE METABOLIC PANEL WITH GFR  Result Value Ref Range   Sodium 140 135 - 146 mmol/L   Potassium 4.7 3.5 - 5.3 mmol/L   Chloride 106 98 - 110 mmol/L   CO2 29 20 - 31 mmol/L   Glucose, Bld 89 65 - 99 mg/dL   BUN 12 7 - 25 mg/dL   Creat 0.96 0.60 - 1.35 mg/dL   Total Bilirubin 0.5 0.2 - 1.2 mg/dL   Alkaline Phosphatase 72 40 - 115 U/L   AST 14 10 - 40 U/L   ALT 16 9 - 46 U/L   Total Protein 6.6 6.1 - 8.1 g/dL   Albumin 4.4 3.6 - 5.1 g/dL   Calcium 9.1 8.6 - 10.3 mg/dL   GFR, Est African American >89 >=60 mL/min   GFR, Est Non African American >89 >=60 mL/min  TSH  Result Value Ref Range   TSH 0.768 0.350 - 4.500 uIU/mL  HgB A1c  Result Value Ref Range   Hgb A1c MFr Bld 5.7 (H) <5.7 %   Mean Plasma Glucose 117 (H) <117 mg/dL  POCT Glucose (CBG)  Result Value Ref Range   POC Glucose 101 (A) 70 - 99 mg/dl      Assessment & Plan:   Encounter Diagnoses  Name Primary?  . Essential hypertension Yes  . Hyperlipemia   . Cigarette nicotine dependence, uncomplicated   . Pain in joint of right shoulder   . Obesity, unspecified      -reviewed labs with pt -reviewed xray shoulder with pt  -rx lovastatin and low fat diet -counseled on smoking cessation -rec pt wear his velcro wrist splint. Ice. nsaids prn -counseled to take bp meds  daily -f/u 3 mo. RTO sooner prn

## 2015-12-02 NOTE — Patient Instructions (Addendum)
Fat and Cholesterol Restricted Diet High levels of fat and cholesterol in your blood may lead to various health problems, such as diseases of the heart, blood vessels, gallbladder, liver, and pancreas. Fats are concentrated sources of energy that come in various forms. Certain types of fat, including saturated fat, may be harmful in excess. Cholesterol is a substance needed by your body in small amounts. Your body makes all the cholesterol it needs. Excess cholesterol comes from the food you eat. When you have high levels of cholesterol and saturated fat in your blood, health problems can develop because the excess fat and cholesterol will gather along the walls of your blood vessels, causing them to narrow. Choosing the right foods will help you control your intake of fat and cholesterol. This will help keep the levels of these substances in your blood within normal limits and reduce your risk of disease. WHAT IS MY PLAN? Your health care provider recommends that you:  Get no more than __________ % of the total calories in your daily diet from fat.  Limit your intake of saturated fat to less than ______% of your total calories each day.  Limit the amount of cholesterol in your diet to less than _________mg per day. WHAT TYPES OF FAT SHOULD I CHOOSE?  Choose healthy fats more often. Choose monounsaturated and polyunsaturated fats, such as olive and canola oil, flaxseeds, walnuts, almonds, and seeds.  Eat more omega-3 fats. Good choices include salmon, mackerel, sardines, tuna, flaxseed oil, and ground flaxseeds. Aim to eat fish at least two times a week.  Limit saturated fats. Saturated fats are primarily found in animal products, such as meats, butter, and cream. Plant sources of saturated fats include palm oil, palm kernel oil, and coconut oil.  Avoid foods with partially hydrogenated oils in them. These contain trans fats. Examples of foods that contain trans fats are stick margarine, some tub  margarines, cookies, crackers, and other baked goods. WHAT GENERAL GUIDELINES DO I NEED TO FOLLOW? These guidelines for healthy eating will help you control your intake of fat and cholesterol:  Check food labels carefully to identify foods with trans fats or high amounts of saturated fat.  Fill one half of your plate with vegetables and green salads.  Fill one fourth of your plate with whole grains. Look for the word "whole" as the first word in the ingredient list.  Fill one fourth of your plate with lean protein foods.  Limit fruit to two servings a day. Choose fruit instead of juice.  Eat more foods that contain soluble fiber. Examples of foods that contain this type of fiber are apples, broccoli, carrots, beans, peas, and barley. Aim to get 20-30 g of fiber per day.  Eat more home-cooked food and less restaurant, buffet, and fast food.  Limit or avoid alcohol.  Limit foods high in starch and sugar.  Limit fried foods.  Cook foods using methods other than frying. Baking, boiling, grilling, and broiling are all great options.  Lose weight if you are overweight. Losing just 5-10% of your initial body weight can help your overall health and prevent diseases such as diabetes and heart disease. WHAT FOODS CAN I EAT? Grains Whole grains, such as whole wheat or whole grain breads, crackers, cereals, and pasta. Unsweetened oatmeal, bulgur, barley, quinoa, or brown rice. Corn or whole wheat flour tortillas. Vegetables Fresh or frozen vegetables (raw, steamed, roasted, or grilled). Green salads. Fruits All fresh, canned (in natural juice), or frozen fruits. Meat and  Other Protein Products Ground beef (85% or leaner), grass-fed beef, or beef trimmed of fat. Skinless chicken or [place]. Ground chicken or [place]. Pork trimmed of fat. All fish and seafood. Eggs. Dried beans, peas, or lentils. Unsalted nuts or seeds. Unsalted canned or dry beans. Dairy Low-fat dairy products, such as skim or  1% milk, 2% or reduced-fat cheeses, low-fat ricotta or cottage cheese, or plain low-fat yogurt. Fats and Oils Tub margarines without trans fats. Light or reduced-fat mayonnaise and salad dressings. Avocado. Olive, canola, sesame, or safflower oils. Natural peanut or almond butter (choose ones without added sugar and oil). The items listed above may not be a complete list of recommended foods or beverages. Contact your dietitian for more options. WHAT FOODS ARE NOT RECOMMENDED? Grains White bread. White pasta. White rice. Cornbread. Bagels, pastries, and croissants. Crackers that contain trans fat. Vegetables White potatoes. Corn. Creamed or fried vegetables. Vegetables in a cheese sauce. Fruits Dried fruits. Canned fruit in light or heavy syrup. Fruit juice. Meat and Other Protein Products Fatty cuts of meat. Ribs, chicken wings, bacon, sausage, bologna, salami, chitterlings, fatback, hot dogs, bratwurst, and packaged luncheon meats. Liver and organ meats. Dairy Whole or 2% milk, cream, half-and-half, and cream cheese. Whole milk cheeses. Whole-fat or sweetened yogurt. Full-fat cheeses. Nondairy creamers and whipped toppings. Processed cheese, cheese spreads, or cheese curds. Sweets and Desserts Corn syrup, sugars, honey, and molasses. Candy. Jam and jelly. Syrup. Sweetened cereals. Cookies, pies, cakes, donuts, muffins, and ice cream. Fats and Oils Butter, stick margarine, lard, shortening, ghee, or bacon fat. Coconut, palm kernel, or palm oils. Beverages Alcohol. Sweetened drinks (such as sodas, lemonade, and fruit drinks or punches). The items listed above may not be a complete list of foods and beverages to avoid. Contact your dietitian for more information.   This information is not intended to replace advice given to you by your health care provider. Make sure you discuss any questions you have with your health care provider.   Document Released: 11/15/2005 Document Revised: 12/06/2014  Document Reviewed: 02/13/2014 Elsevier Interactive Patient Education 2016 [employer]   Carpal Tunnel Syndrome Carpal tunnel syndrome is a condition that causes pain in your hand and arm. The carpal tunnel is a narrow area located on the palm side of your wrist. Repeated wrist motion or certain diseases may cause swelling within the tunnel. This swelling pinches the main nerve in the wrist (median nerve). CAUSES  This condition may be caused by:   Repeated wrist motions.  Wrist injuries.  Arthritis.  A cyst or tumor in the carpal tunnel.  Fluid buildup during pregnancy. Sometimes the cause of this condition is not known.  RISK FACTORS This condition is more likely to develop in:   People who have jobs that cause them to repeatedly move their wrists in the same motion, such as butchers and cashiers.  Women.  People with certain conditions, such as:  Diabetes.  Obesity.  An underactive thyroid (hypothyroidism).  Kidney failure. SYMPTOMS  Symptoms of this condition include:   A tingling feeling in your fingers, especially in your thumb, index, and middle fingers.  Tingling or numbness in your hand.  An aching feeling in your entire arm, especially when your wrist and elbow are bent for long periods of time.  Wrist pain that goes up your arm to your shoulder.  Pain that goes down into your palm or fingers.  A weak feeling in your hands. You may have trouble grabbing and holding items. Your  symptoms may feel worse during the night.  DIAGNOSIS  This condition is diagnosed with a medical history and physical exam. You may also have tests, including:   An electromyogram (EMG). This test measures electrical signals sent by your nerves into the muscles.  X-rays. TREATMENT  Treatment for this condition includes:  Lifestyle changes. It is important to stop doing or modify the activity that caused your condition.  Physical or occupational therapy.  Medicines for  pain and inflammation. This may include medicine that is injected into your wrist.  A wrist splint.  Surgery. HOME CARE INSTRUCTIONS  If You Have a Splint:  Wear it as told by your health care provider. Remove it only as told by your health care provider.  Loosen the splint if your fingers become numb and tingle, or if they turn cold and blue.  Keep the splint clean and dry. General Instructions  Take over-the-counter and prescription medicines only as told by your health care provider.  Rest your wrist from any activity that may be causing your pain. If your condition is work related, talk to your employer about changes that can be made, such as getting a wrist pad to use while typing.  If directed, apply ice to the painful area:  Put ice in a plastic bag.  Place a towel between your skin and the bag.  Leave the ice on for 20 minutes, 2-3 times per day.  Keep all follow-up visits as told by your health care provider. This is important.  Do any exercises as told by your health care provider, physical therapist, or occupational therapist. SEEK MEDICAL CARE IF:   You have new symptoms.  Your pain is not controlled with medicines.  Your symptoms get worse.   This information is not intended to replace advice given to you by your health care provider. Make sure you discuss any questions you have with your health care provider.   Document Released: 11/12/2000 Document Revised: 08/06/2015 Document Reviewed: 04/02/2015 Elsevier Interactive Patient Education [employer]

## 2015-12-07 DIAGNOSIS — E669 Obesity, unspecified: Secondary | ICD-10-CM | POA: Insufficient documentation

## 2015-12-07 DIAGNOSIS — M25511 Pain in right shoulder: Secondary | ICD-10-CM | POA: Insufficient documentation

## 2016-03-09 ENCOUNTER — Ambulatory Visit: Payer: Self-pay | Admitting: Physician Assistant

## 2016-03-09 ENCOUNTER — Encounter: Payer: Self-pay | Admitting: Physician Assistant

## 2016-03-09 VITALS — BP 134/76 | HR 70 | Temp 98.2°F | Ht 66.5 in | Wt 211.0 lb

## 2016-03-09 DIAGNOSIS — E669 Obesity, unspecified: Secondary | ICD-10-CM

## 2016-03-09 DIAGNOSIS — E785 Hyperlipidemia, unspecified: Secondary | ICD-10-CM

## 2016-03-09 DIAGNOSIS — F32A Depression, unspecified: Secondary | ICD-10-CM

## 2016-03-09 DIAGNOSIS — F329 Major depressive disorder, single episode, unspecified: Secondary | ICD-10-CM

## 2016-03-09 DIAGNOSIS — I10 Essential (primary) hypertension: Secondary | ICD-10-CM

## 2016-03-09 DIAGNOSIS — F17219 Nicotine dependence, cigarettes, with unspecified nicotine-induced disorders: Secondary | ICD-10-CM | POA: Insufficient documentation

## 2016-03-09 NOTE — Progress Notes (Signed)
BP 134/76 mmHg  Pulse 70  Temp(Src) 98.2 F (36.8 C)  Ht 5' 6.5" (1.689 m)  Wt 211 lb (95.709 kg)  BMI 33.55 kg/m2  SpO2 97%   Subjective:    Patient ID: Ian Wall, male    DOB: 11/25/1976, 40 y.o.   MRN: [id number]  HPI: Ian Wall is a 40 y.o. male presenting on 03/09/2016 for Hypertension and Hyperlipidemia   HPI   Pt states long-time depression.  He has never sought treatment for this.  He thinks abstractly about suicide but has never thought about plans to do so. Says he doesn't think he could ever do it b/c it would be too much to put his family through.  Relevant past medical, surgical, family and social history reviewed and updated as indicated. Interim medical history since our last visit reviewed. Allergies and medications reviewed and updated.   Current outpatient prescriptions:  .  lisinopril (PRINIVIL,ZESTRIL) 20 MG tablet, Take 1 tablet (20 mg total) by mouth daily., Disp: 90 tablet, Rfl: 1 .  lovastatin (MEVACOR) 20 MG tablet, Take 1 tablet (20 mg total) by mouth at bedtime., Disp: 90 tablet, Rfl: 1  Review of Systems  Constitutional: Negative for fever, chills, diaphoresis, appetite change, fatigue and unexpected weight change.  HENT: Positive for dental problem and sneezing. Negative for congestion, drooling, ear pain, facial swelling, hearing loss, mouth sores, sore throat, trouble swallowing and voice change.   Eyes: Negative for pain, discharge, redness, itching and visual disturbance.  Respiratory: Positive for cough, shortness of breath and wheezing. Negative for choking.   Cardiovascular: Negative for chest pain, palpitations and leg swelling.  Gastrointestinal: Negative for vomiting, abdominal pain, diarrhea, constipation and blood in stool.  Endocrine: Negative for cold intolerance, heat intolerance and polydipsia.  Genitourinary: Negative for dysuria, hematuria and decreased urine volume.  Musculoskeletal: Positive for arthralgias. Negative  for back pain and gait problem.  Skin: Negative for rash.  Allergic/Immunologic: Negative for environmental allergies.  Neurological: Negative for seizures, syncope, light-headedness and headaches.  Hematological: Negative for adenopathy.  Psychiatric/Behavioral: Positive for suicidal ideas and dysphoric mood. Negative for agitation. The patient is nervous/anxious.     Per HPI unless specifically indicated above     Objective:    BP 134/76 mmHg  Pulse 70  Temp(Src) 98.2 F (36.8 C)  Ht 5' 6.5" (1.689 m)  Wt 211 lb (95.709 kg)  BMI 33.55 kg/m2  SpO2 97%  Wt Readings from Last 3 Encounters:  03/09/16 211 lb (95.709 kg)  12/02/15 230 lb 6.4 oz (104.509 kg)  10/27/15 234 lb 6.4 oz (106.323 kg)    Physical Exam  Constitutional: He is oriented to person, place, and time. He appears well-developed and well-nourished.  HENT:  Head: Normocephalic and atraumatic.  Neck: Neck supple.  Cardiovascular: Normal rate and regular rhythm.   Pulmonary/Chest: Effort normal and breath sounds normal. He has no wheezes.  Abdominal: Soft. Bowel sounds are normal. There is no hepatosplenomegaly. There is no tenderness.  Musculoskeletal: He exhibits no edema.  Lymphadenopathy:    He has no cervical adenopathy.  Neurological: He is alert and oriented to person, place, and time.  Skin: Skin is warm and dry.  Psychiatric: He has a normal mood and affect. His behavior is normal.  Vitals reviewed.       Assessment & Plan:   Encounter Diagnoses  Name Primary?  . Essential hypertension Yes  . Hyperlipidemia   . Depression   . Obesity, unspecified   .  Cigarette nicotine dependence with nicotine-induced disorder      -Check lipids- will call with results -[facility]. Gave pt card and information he will need to go to walk-in.  -discussed relationship between smoking and sob/wheezing -F/u 1 month.  [facility], he can cancel the 1 month f/u and just RTO  in 3 months for htn & cholesterol.  Pt states understanding

## 2016-03-19 LAB — COMPLETE METABOLIC PANEL WITH GFR
ALBUMIN: 4.4 g/dL (ref 3.6–5.1)
ALT: 16 U/L (ref 9–46)
AST: 16 U/L (ref 10–40)
Alkaline Phosphatase: 72 U/L (ref 40–115)
BILIRUBIN TOTAL: 1.2 mg/dL (ref 0.2–1.2)
BUN: 7 mg/dL (ref 7–25)
CALCIUM: 9.6 mg/dL (ref 8.6–10.3)
CO2: 30 mmol/L (ref 20–31)
Chloride: 100 mmol/L (ref 98–110)
Creat: 0.9 mg/dL (ref 0.60–1.35)
GLUCOSE: 90 mg/dL (ref 65–99)
Potassium: 4.3 mmol/L (ref 3.5–5.3)
Sodium: 139 mmol/L (ref 135–146)
TOTAL PROTEIN: 6.8 g/dL (ref 6.1–8.1)

## 2016-03-19 LAB — LIPID PANEL
CHOL/HDL RATIO: 3.6 ratio (ref <=5.0)
Cholesterol: 126 mg/dL (ref 125–200)
HDL: 35 mg/dL — AB (ref >=40)
LDL Cholesterol: 67 mg/dL (ref <130)
TRIGLYCERIDES: 120 mg/dL (ref <150)
VLDL: 24 mg/dL (ref <30)

## 2016-04-06 ENCOUNTER — Encounter: Payer: Self-pay | Admitting: Physician Assistant

## 2016-04-06 ENCOUNTER — Ambulatory Visit: Payer: Self-pay | Admitting: Physician Assistant

## 2016-04-06 VITALS — BP 142/96 | HR 86 | Temp 97.9°F | Ht 66.5 in | Wt 204.8 lb

## 2016-04-06 DIAGNOSIS — F17219 Nicotine dependence, cigarettes, with unspecified nicotine-induced disorders: Secondary | ICD-10-CM

## 2016-04-06 DIAGNOSIS — E669 Obesity, unspecified: Secondary | ICD-10-CM

## 2016-04-06 DIAGNOSIS — E785 Hyperlipidemia, unspecified: Secondary | ICD-10-CM

## 2016-04-06 DIAGNOSIS — I10 Essential (primary) hypertension: Secondary | ICD-10-CM

## 2016-04-06 MED ORDER — LISINOPRIL 20 MG PO TABS: 20.0000 mg | ORAL_TABLET | Freq: Every day | ORAL | Status: AC

## 2016-04-06 MED ORDER — LOVASTATIN 20 MG PO TABS: 20.0000 mg | ORAL_TABLET | Freq: Every day | ORAL | Status: AC

## 2016-04-06 NOTE — Progress Notes (Signed)
BP 142/96 mmHg  Pulse 86  Temp(Src) 97.9 F (36.6 C)  Ht 5' 6.5" (1.689 m)  Wt 204 lb 12 oz (92.874 kg)  BMI 32.56 kg/m2  SpO2 97%   Subjective:    Patient ID: Ian Wall, male    DOB: 19-Apr-1976, 40 y.o.   MRN: [id number]  HPI: Ian Wall is a 40 y.o. male presenting on 04/06/2016 for Depression   HPI   Pt says [facility]'t [facility].  He says he quit having those feelings.  He says things are looking up- he got a good job working through a [employer] company. He hopes to have insurance at some point after he has been there for a while  Relevant past medical, surgical, family and social history reviewed and updated as indicated. Interim medical history since our last visit reviewed. Allergies and medications reviewed and updated.   Current outpatient prescriptions:  .  lisinopril (PRINIVIL,ZESTRIL) 20 MG tablet, Take 1 tablet (20 mg total) by mouth daily. (Patient not taking: Reported on 04/06/2016), Disp: 90 tablet, Rfl: 1 .  lovastatin (MEVACOR) 20 MG tablet, Take 1 tablet (20 mg total) by mouth at bedtime. (Patient not taking: Reported on 04/06/2016), Disp: 90 tablet, Rfl: 1   Review of Systems  Constitutional: Negative for fever, chills, diaphoresis, appetite change, fatigue and unexpected weight change.  HENT: Positive for dental problem. Negative for congestion, drooling, ear pain, facial swelling, hearing loss, mouth sores, sneezing, sore throat, trouble swallowing and voice change.   Eyes: Negative for pain, discharge, redness, itching and visual disturbance.  Respiratory: Negative for cough, choking, shortness of breath and wheezing.   Cardiovascular: Negative for chest pain, palpitations and leg swelling.  Gastrointestinal: Negative for vomiting, abdominal pain, diarrhea, constipation and blood in stool.  Endocrine: Negative for cold intolerance, heat intolerance and polydipsia.  Genitourinary: Negative for dysuria, hematuria and decreased urine volume.   Musculoskeletal: Positive for back pain. Negative for arthralgias and gait problem.  Skin: Negative for rash.  Allergic/Immunologic: Negative for environmental allergies.  Neurological: Negative for seizures, syncope, light-headedness and headaches.  Hematological: Negative for adenopathy.  Psychiatric/Behavioral: Negative for suicidal ideas, dysphoric mood and agitation. The patient is nervous/anxious.     Per HPI unless specifically indicated above     Objective:    BP 142/96 mmHg  Pulse 86  Temp(Src) 97.9 F (36.6 C)  Ht 5' 6.5" (1.689 m)  Wt 204 lb 12 oz (92.874 kg)  BMI 32.56 kg/m2  SpO2 97%  Wt Readings from Last 3 Encounters:  04/06/16 204 lb 12 oz (92.874 kg)  03/09/16 211 lb (95.709 kg)  12/02/15 230 lb 6.4 oz (104.509 kg)    Physical Exam  Constitutional: He is oriented to person, place, and time. He appears well-developed and well-nourished.  HENT:  Head: Normocephalic and atraumatic.  Neck: Neck supple.  Cardiovascular: Normal rate and regular rhythm.   Pulmonary/Chest: Effort normal and breath sounds normal. He has no wheezes.  Abdominal: Soft. Bowel sounds are normal. There is no hepatosplenomegaly. There is no tenderness.  Musculoskeletal: He exhibits no edema.  Lymphadenopathy:    He has no cervical adenopathy.  Neurological: He is alert and oriented to person, place, and time.  Skin: Skin is warm and dry.  Psychiatric: He has a normal mood and affect. His behavior is normal.  Vitals reviewed.   Results for orders placed or performed in visit on 03/09/16  Lipid Profile  Result Value Ref Range   Cholesterol 126 125 -  200 mg/dL   Triglycerides 409 <811 mg/dL   HDL 35 (L) >=91 mg/dL   Total CHOL/HDL Ratio 3.6 <=5.0 Ratio   VLDL 24 <30 mg/dL   LDL Cholesterol 67 <478 mg/dL  COMPLETE METABOLIC PANEL WITH GFR  Result Value Ref Range   Sodium 139 135 - 146 mmol/L   Potassium 4.3 3.5 - 5.3 mmol/L   Chloride 100 98 - 110 mmol/L   CO2 30 20 - 31 mmol/L    Glucose, Bld 90 65 - 99 mg/dL   BUN 7 7 - 25 mg/dL   Creat 2.95 6.21 - 3.08 mg/dL   Total Bilirubin 1.2 0.2 - 1.2 mg/dL   Alkaline Phosphatase 72 40 - 115 U/L   AST 16 10 - 40 U/L   ALT 16 9 - 46 U/L   Total Protein 6.8 6.1 - 8.1 g/dL   Albumin 4.4 3.6 - 5.1 g/dL   Calcium 9.6 8.6 - 65.7 mg/dL   GFR, Est African American >89 >=60 mL/min   GFR, Est Non African American >89 >=60 mL/min      Assessment & Plan:   Encounter Diagnoses  Name Primary?  . Essential hypertension Yes  . Hyperlipidemia   . Cigarette nicotine dependence with nicotine-induced disorder   . Obesity, unspecified    -reviewed labs with pt -pt counseled to Get back on meds -pt couynseled on smoking cessation -F/u 3 months.  RTO sooner prn

## 2016-06-03 ENCOUNTER — Encounter (HOSPITAL_COMMUNITY): Payer: Self-pay | Admitting: *Deleted

## 2016-06-03 ENCOUNTER — Emergency Department (HOSPITAL_COMMUNITY)
Admission: EM | Admit: 2016-06-03 | Discharge: 2016-06-04 | Disposition: A | Discharge status: Home / Self Care | Payer: Self-pay | Attending: Emergency Medicine | Admitting: Emergency Medicine

## 2016-06-03 ENCOUNTER — Emergency Department (HOSPITAL_COMMUNITY): Payer: Self-pay

## 2016-06-03 DIAGNOSIS — Z79899 Other long term (current) drug therapy: Secondary | ICD-10-CM | POA: Insufficient documentation

## 2016-06-03 DIAGNOSIS — F17210 Nicotine dependence, cigarettes, uncomplicated: Secondary | ICD-10-CM | POA: Insufficient documentation

## 2016-06-03 DIAGNOSIS — I10 Essential (primary) hypertension: Secondary | ICD-10-CM | POA: Insufficient documentation

## 2016-06-03 DIAGNOSIS — R0789 Other chest pain: Secondary | ICD-10-CM | POA: Insufficient documentation

## 2016-06-03 DIAGNOSIS — R079 Chest pain, unspecified: Secondary | ICD-10-CM

## 2016-06-03 DIAGNOSIS — R61 Generalized hyperhidrosis: Secondary | ICD-10-CM | POA: Insufficient documentation

## 2016-06-03 DIAGNOSIS — R0602 Shortness of breath: Secondary | ICD-10-CM | POA: Insufficient documentation

## 2016-06-03 LAB — CBC WITH DIFFERENTIAL/PLATELET
Basophils Absolute: 0 10*3/uL (ref 0.0–0.1)
Basophils Relative: 0 %
Eosinophils Absolute: 0.7 10*3/uL (ref 0.0–0.7)
Eosinophils Relative: 7 %
HCT: 43.3 % (ref 39.0–52.0)
HEMOGLOBIN: 14.4 g/dL (ref 13.0–17.0)
LYMPHS ABS: 2.1 10*3/uL (ref 0.7–4.0)
LYMPHS PCT: 19 %
MCH: 30.8 pg (ref 26.0–34.0)
MCHC: 33.3 g/dL (ref 30.0–36.0)
MCV: 92.7 fL (ref 78.0–100.0)
MONO ABS: 0.5 10*3/uL (ref 0.1–1.0)
Monocytes Relative: 5 %
NEUTROS ABS: 7.5 10*3/uL (ref 1.7–7.7)
NEUTROS PCT: 69 %
Platelets: 200 10*3/uL (ref 150–400)
RBC: 4.67 MIL/uL (ref 4.22–5.81)
RDW: 13.3 % (ref 11.5–15.5)
WBC: 10.8 10*3/uL — ABNORMAL HIGH (ref 4.0–10.5)

## 2016-06-03 LAB — BASIC METABOLIC PANEL
Anion gap: 8 (ref 5–15)
BUN: 11 mg/dL (ref 6–20)
CALCIUM: 8.4 mg/dL — AB (ref 8.9–10.3)
CHLORIDE: 104 mmol/L (ref 101–111)
CO2: 26 mmol/L (ref 22–32)
CREATININE: 0.74 mg/dL (ref 0.61–1.24)
GFR calc Af Amer: >60 mL/min (ref >60)
GFR calc non Af Amer: >60 mL/min (ref >60)
GLUCOSE: 117 mg/dL — AB (ref 65–99)
Potassium: 2.8 mmol/L — ABNORMAL LOW (ref 3.5–5.1)
Sodium: 138 mmol/L (ref 135–145)

## 2016-06-03 LAB — TROPONIN I: Troponin I: <0.03 ng/mL (ref <0.03)

## 2016-06-03 MED ORDER — ASPIRIN 81 MG PO CHEW
324.0000 mg | CHEWABLE_TABLET | Freq: Once | ORAL | Status: AC
  Administered 2016-06-03: 324 mg via ORAL
  Filled 2016-06-03: qty 4

## 2016-06-03 NOTE — ED Provider Notes (Signed)
CSN: [id number]     Arrival date & time 06/03/16  2256 History  By signing my name below, I, Linna Darner, attest that this documentation has been prepared under the direction and in the presence of physician practitioner, Dione Booze, MD. Electronically Signed: Linna Darner, Scribe. 06/03/2016. 11:13 PM.    Chief Complaint  Patient presents with  . Chest Pain    The history is provided by the patient. No language interpreter was used.    HPI Comments: Ian Wall is a 40 y.o. male with PMHx of HTN and HLD who presents to the Emergency Department complaining of sudden onset chest tightness beginning approximately 3 hours ago. Pt reports that he was about to go to work when he experienced an episode of chest tightness that lasted around an hour and then resolved. No alleviating or exacerbating factors noted. He reports that his tightness has not come back since it resolved. 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 Pt reports associated mild SOB and mild diaphoresis. He states that he is feeling normal currently. Pt smokes 1.5 packs a day and drinks occasionally. Pt has not taken aspirin today. He denies FHx of heart problems. He further denies chest pain, nausea, vomiting, or any other associated symptoms.  PCP: Dr. Katherene Ponto  Past Medical History  Diagnosis Date  . Gout   . Back pain   . Hypertension   . Hypercholesteremia   . Gout    Past Surgical History  Procedure Laterality Date  . Breast surgery  1993    breast reduction   . Wrist surgery Left 2002    tendon repair   Family History  Problem Relation Age of Onset  . Diabetes Mother   . Diabetes Father   . Hypertension Father   . Hyperlipidemia Father   . COPD Father    Social History  Substance Use Topics  . Smoking status: Current Every Day Smoker -- 1.00 packs/day for 25 years    Types: Cigarettes  . Smokeless tobacco:  Former Neurosurgeon  . Alcohol Use: Yes     Comment: rare    Review of Systems  Constitutional: Positive for diaphoresis (mild, resolved).  Respiratory: Positive for chest tightness (resolved) and shortness of breath (mild, resolved).   Cardiovascular: Negative for chest pain.  Gastrointestinal: Negative for nausea and vomiting.  All other systems reviewed and are negative.   Allergies  Review of patient's allergies indicates no known allergies.  Home Medications   Prior to Admission medications   Medication Sig Start Date End Date Taking? Authorizing Provider  lisinopril (PRINIVIL,ZESTRIL) 20 MG tablet Take 1 tablet (20 mg total) by mouth daily. 04/06/16  Yes Jacquelin Hawking, PA-C  lovastatin (MEVACOR) 20 MG tablet Take 1 tablet (20 mg total) by mouth at bedtime. 04/06/16  Yes Shannon McElroy, PA-C   BP 160/101 mmHg  Pulse 77  Temp(Src) 98.4 F (36.9 C) (Oral)  Resp 18  Ht  (1.676 m)  Wt 185 lb (83.915 kg)  BMI 29.87 kg/m2  SpO2 96% Physical Exam  Constitutional: He is oriented to person, place, and time. He appears well-developed and well-nourished. No distress.  HENT:  Head: Normocephalic and atraumatic.  Eyes: Conjunctivae and EOM are normal. Pupils are equal, round, and reactive to light.  Neck: Normal range of motion. Neck supple. No JVD present.  Cardiovascular: Normal rate, regular rhythm, normal heart sounds  and intact distal pulses.   No murmur heard. Pulmonary/Chest: Effort normal and breath sounds normal. He has no wheezes. He has no rales. He exhibits no tenderness.  Abdominal: Soft. Bowel sounds are normal. He exhibits no distension and no mass. There is no tenderness.  Musculoskeletal: Normal range of motion. He exhibits no edema.  Lymphadenopathy:    He has no cervical adenopathy.  Neurological: He is alert and oriented to person, place, and time. No cranial nerve deficit. He exhibits normal muscle tone. Coordination normal.  Skin: Skin is warm and dry. No rash  noted.  Psychiatric: He has a normal mood and affect. His behavior is normal. Judgment and thought content normal.  Nursing note and vitals reviewed.   ED Course  Procedures (including critical care time)  DIAGNOSTIC STUDIES: Oxygen Saturation is 96% on RA, adequate by my interpretation.    COORDINATION OF CARE: 11:13 PM Discussed treatment plan with pt at bedside and pt agreed to plan.  Labs Review Results for orders placed or performed during the hospital encounter of 06/03/16  Troponin I  Result Value Ref Range   Troponin I <0.03 <0.03 ng/mL  CBC with Differential  Result Value Ref Range   WBC 10.8 (H) 4.0 - 10.5 K/uL   RBC 4.67 4.22 - 5.81 MIL/uL   Hemoglobin 14.4 13.0 - 17.0 g/dL   HCT 91.443.3 78.239.0 - 95.652.0 %   MCV 92.7 78.0 - 100.0 fL   MCH 30.8 26.0 - 34.0 pg   MCHC 33.3 30.0 - 36.0 g/dL   RDW 21.313.3 08.611.5 - 57.815.5 %   Platelets 200 150 - 400 K/uL   Neutrophils Relative % 69 %   Neutro Abs 7.5 1.7 - 7.7 K/uL   Lymphocytes Relative 19 %   Lymphs Abs 2.1 0.7 - 4.0 K/uL   Monocytes Relative 5 %   Monocytes Absolute 0.5 0.1 - 1.0 K/uL   Eosinophils Relative 7 %   Eosinophils Absolute 0.7 0.0 - 0.7 K/uL   Basophils Relative 0 %   Basophils Absolute 0.0 0.0 - 0.1 K/uL  Basic metabolic panel  Result Value Ref Range   Sodium 138 135 - 145 mmol/L   Potassium 2.8 (L) 3.5 - 5.1 mmol/L   Chloride 104 101 - 111 mmol/L   CO2 26 22 - 32 mmol/L   Glucose, Bld 117 (H) 65 - 99 mg/dL   BUN 11 6 - 20 mg/dL   Creatinine, Ser 4.690.74 0.61 - 1.24 mg/dL   Calcium 8.4 (L) 8.9 - 10.3 mg/dL   GFR calc non Af Amer >60 >60 mL/min   GFR calc Af Amer >60 >60 mL/min   Anion gap 8 5 - 15  Troponin I  Result Value Ref Range   Troponin I <0.03 <0.03 ng/mL    Imaging Review Dg Chest 2 View  06/03/2016  CLINICAL DATA:  Sudden onset of chest pain 3 hours prior. EXAM: CHEST  2 VIEW COMPARISON:  None. FINDINGS: The cardiomediastinal contours are normal. The lungs are clear. Pulmonary vasculature is  normal. No consolidation, pleural effusion, or pneumothorax. No acute osseous abnormalities are seen. IMPRESSION: No acute pulmonary process. Electronically Signed   By: Rubye OaksMelanie  Ehinger M.D.   On: 06/03/2016 23:53   I have personally reviewed and evaluated these images and lab results as part of my medical decision-making.   EKG Interpretation   Date/Time:  Thursday June 03 2016 23:08:21 EDT Ventricular Rate:  81 PR Interval:    QRS Duration: 109 QT Interval:  372 QTC Calculation: 432 R Axis:   64 Text Interpretation:  Sinus rhythm Abnormal R-wave progression, early  transition Otherwise within normal limits No old tracing to compare  Confirmed by [facility]  MD, Andrus Sharp ([id number]) on 06/03/2016 11:14:43 PM      MDM   Final diagnoses:  Chest pain, unspecified chest pain type    Chest pain which has resolved. The fact that this is his second episode is somewhat concerning although episodes do not seem to have been related to exertion. He does have risk factor of tobacco abuse as well as hypertension and hyperlipidemia. Initial ECG and troponin are normal. He was kept in the ED and troponin repeated which was still normal. This was more than 6 hours following the time that he went pain-free so it is felt to be sufficient to rule out myocardial infarction. In light of his risk factors, he is referred to cardiology for outpatient stress testing.   I personally performed the services described in this documentation, which was scribed in my presence. The recorded information has been reviewed and is accurate.     Dione Boozeavid Alesi Zachery, MD 06/04/16 [phone]

## 2016-06-03 NOTE — ED Notes (Signed)
EKG done and given to Dr Preston FleetingGlick. Patient in gown and on cardiac monitor.

## 2016-06-03 NOTE — ED Notes (Signed)
Pt reports a chest tightness that started around 1900 today. Pt says has had these pains before but went away & this time it has continued

## 2016-06-04 LAB — TROPONIN I: Troponin I: <0.03 ng/mL (ref <0.03)

## 2016-06-04 NOTE — Discharge Instructions (Signed)
Nonspecific Chest Pain  °Chest pain can be caused by many different conditions. There is always a chance that your pain could be related to something serious, such as a heart attack or a blood clot in your lungs. Chest pain can also be caused by conditions that are not life-threatening. If you have chest pain, it is very important to follow up with your health care provider. °CAUSES  °Chest pain can be caused by: °· Heartburn. °· Pneumonia or bronchitis. °· Anxiety or stress. °· Inflammation around your heart (pericarditis) or lung (pleuritis or pleurisy). °· A blood clot in your lung. °· A collapsed lung (pneumothorax). It can develop suddenly on its own (spontaneous pneumothorax) or from trauma to the chest. °· Shingles infection (varicella-zoster virus). °· Heart attack. °· Damage to the bones, muscles, and cartilage that make up your chest wall. This can include: °¨ Bruised bones due to injury. °¨ Strained muscles or cartilage due to frequent or repeated coughing or overwork. °¨ Fracture to one or more ribs. °¨ Sore cartilage due to inflammation (costochondritis). °RISK FACTORS  °Risk factors for chest pain may include: °· Activities that increase your risk for trauma or injury to your chest. °· Respiratory infections or conditions that cause frequent coughing. °· Medical conditions or overeating that can cause heartburn. °· Heart disease or family history of heart disease. °· Conditions or health behaviors that increase your risk of developing a blood clot. °· Having had chicken pox (varicella zoster). °SIGNS AND SYMPTOMS °Chest pain can feel like: °· Burning or tingling on the surface of your chest or deep in your chest. °· Crushing, pressure, aching, or squeezing pain. °· Dull or sharp pain that is worse when you move, cough, or take a deep breath. °· Pain that is also felt in your back, neck, shoulder, or arm, or pain that spreads to any of these areas. °Your chest pain may come and go, or it may stay  constant. °DIAGNOSIS °Lab tests or other studies may be needed to find the cause of your pain. Your health care provider may have you take a test called an ambulatory ECG (electrocardiogram). An ECG records your heartbeat patterns at the time the test is performed. You may also have other tests, such as: °· Transthoracic echocardiogram (TTE). During echocardiography, sound waves are used to create a picture of all of the heart structures and to look at how blood flows through your heart. °· Transesophageal echocardiogram (TEE). This is a more advanced imaging test that obtains images from inside your body. It allows your health care provider to see your heart in finer detail. °· Cardiac monitoring. This allows your health care provider to monitor your heart rate and rhythm in real time. °· Holter monitor. This is a portable device that records your heartbeat and can help to diagnose abnormal heartbeats. It allows your health care provider to track your heart activity for several days, if needed. °· Stress tests. These can be done through exercise or by taking medicine that makes your heart beat more quickly. °· Blood tests. °· Imaging tests. °TREATMENT  °Your treatment depends on what is causing your chest pain. Treatment may include: °· Medicines. These may include: °¨ Acid blockers for heartburn. °¨ Anti-inflammatory medicine. °¨ Pain medicine for inflammatory conditions. °¨ Antibiotic medicine, if an infection is present. °¨ Medicines to dissolve blood clots. °¨ Medicines to treat coronary artery disease. °· Supportive care for conditions that do not require medicines. This may include: °¨ Resting. °¨ Applying heat   or cold packs to injured areas. °¨ Limiting activities until pain decreases. °HOME CARE INSTRUCTIONS °· If you were prescribed an antibiotic medicine, finish it all even if you start to feel better. °· Avoid any activities that bring on chest pain. °· Do not use any tobacco products, including  cigarettes, chewing tobacco, or electronic cigarettes. If you need help quitting, ask your health care provider. °· Do not drink alcohol. °· Take medicines only as directed by your health care provider. °· Keep all follow-up visits as directed by your health care provider. This is important. This includes any further testing if your chest pain does not go away. °· If heartburn is the cause for your chest pain, you may be told to keep your head raised (elevated) while sleeping. This reduces the chance that acid will go from your stomach into your esophagus. °· Make lifestyle changes as directed by your health care provider. These may include: °¨ Getting regular exercise. Ask your health care provider to suggest some activities that are safe for you. °¨ Eating a heart-healthy diet. A registered dietitian can help you to learn healthy eating options. °¨ Maintaining a healthy weight. °¨ Managing diabetes, if necessary. °¨ Reducing stress. °SEEK MEDICAL CARE IF: °· Your chest pain does not go away after treatment. °· You have a rash with blisters on your chest. °· You have a fever. °SEEK IMMEDIATE MEDICAL CARE IF:  °· Your chest pain is worse. °· You have an increasing cough, or you cough up blood. °· You have severe abdominal pain. °· You have severe weakness. °· You faint. °· You have chills. °· You have sudden, unexplained chest discomfort. °· You have sudden, unexplained discomfort in your arms, back, neck, or jaw. °· You have shortness of breath at any time. °· You suddenly start to sweat, or your skin gets clammy. °· You feel nauseous or you vomit. °· You suddenly feel light-headed or dizzy. °· Your heart begins to beat quickly, or it feels like it is skipping beats. °These symptoms may represent a serious problem that is an emergency. Do not wait to see if the symptoms will go away. Get medical help right away. Call your local emergency services (911 in the [place] Do not drive yourself to the hospital. °  °This  information is not intended to replace advice given to you by your health care provider. Make sure you discuss any questions you have with your health care provider. °  °Document Released: 08/25/2005 Document Revised: 12/06/2014 Document Reviewed: 06/21/2014 °Elsevier Interactive Patient Education ©2016 Elsevier Inc. ° °

## 2016-06-17 ENCOUNTER — Encounter (HOSPITAL_COMMUNITY): Payer: Self-pay | Admitting: Emergency Medicine

## 2016-06-17 ENCOUNTER — Emergency Department (HOSPITAL_COMMUNITY)
Admission: EM | Admit: 2016-06-17 | Discharge: 2016-06-17 | Disposition: A | Discharge status: Home / Self Care | Payer: Self-pay | Attending: Emergency Medicine | Admitting: Emergency Medicine

## 2016-06-17 DIAGNOSIS — I10 Essential (primary) hypertension: Secondary | ICD-10-CM | POA: Insufficient documentation

## 2016-06-17 DIAGNOSIS — M549 Dorsalgia, unspecified: Secondary | ICD-10-CM

## 2016-06-17 DIAGNOSIS — M546 Pain in thoracic spine: Secondary | ICD-10-CM | POA: Insufficient documentation

## 2016-06-17 DIAGNOSIS — F17210 Nicotine dependence, cigarettes, uncomplicated: Secondary | ICD-10-CM | POA: Insufficient documentation

## 2016-06-17 MED ORDER — NAPROXEN 500 MG PO TABS: ORAL_TABLET | ORAL | Status: DC

## 2016-06-17 MED ORDER — CYCLOBENZAPRINE HCL 5 MG PO TABS: 5.0000 mg | ORAL_TABLET | Freq: Three times a day (TID) | ORAL | Status: DC | PRN

## 2016-06-17 MED ORDER — DIAZEPAM 5 MG/ML IJ SOLN
5.0000 mg | Freq: Once | INTRAMUSCULAR | Status: AC
  Administered 2016-06-17: 5 mg via INTRAMUSCULAR
  Filled 2016-06-17: qty 2

## 2016-06-17 MED ORDER — KETOROLAC TROMETHAMINE 60 MG/2ML IM SOLN
60.0000 mg | Freq: Once | INTRAMUSCULAR | Status: AC
  Administered 2016-06-17: 60 mg via INTRAMUSCULAR
  Filled 2016-06-17: qty 2

## 2016-06-17 MED ORDER — DEXAMETHASONE SODIUM PHOSPHATE 10 MG/ML IJ SOLN
10.0000 mg | Freq: Once | INTRAMUSCULAR | Status: AC
  Administered 2016-06-17: 10 mg via INTRAMUSCULAR
  Filled 2016-06-17: qty 1

## 2016-06-17 NOTE — ED Notes (Signed)
Pt verbalized understanding of d/c instructions, prescriptions, and follow up care at this time. States a family member is picking him up today, pt reminded no driving for several hours.

## 2016-06-17 NOTE — Discharge Instructions (Signed)
Use ice and heat for comfort on your back. Take the medications as prescribed (both should be $4 at [facility]). [facility] the [facility] to help you learn to lift things so you don't keep re-injuring your back.  Follow up at the [facility] if you aren't improving in the next week.   Thoracic Strain A thoracic strain, which is sometimes called a mid-back strain, is an injury to the muscles or tendons that attach to the upper part of your back behind your chest. This type of injury occurs when a muscle is overstretched or overloaded.  Thoracic strains can range from mild to severe. Mild strains may involve stretching a muscle or tendon without tearing it. These injuries may heal in 1-2 weeks. More severe strains involve tearing of muscle fibers or tendons. These will cause more pain and may take 6-8 weeks to heal. CAUSES This condition may be caused by:  An injury in which a sudden force is placed on the muscle.  Exercising without properly warming up.  Overuse of the muscle.  Improper form during certain movements.  Other injuries that surround or cause stress on the mid-back, causing a strain on the muscles. In some cases, the cause may not be known. RISK FACTORS This injury is more common in:  Athletes.  People with obesity. SYMPTOMS The main symptom of this condition is pain, especially with movement. Other symptoms include:  Bruising.  Swelling.  Spasm. DIAGNOSIS This condition may be diagnosed with a physical exam. X-rays may be taken to check for a fracture. TREATMENT This condition may be treated with:  Resting and icing the injured area.  Physical therapy. This will involve doing stretching and strengthening exercises.  Medicines for pain and inflammation. HOME CARE INSTRUCTIONS  Rest as needed. Follow instructions from your health care provider about any restrictions on activity.  If directed, apply ice to the injured area:  Put ice in a plastic  bag.  Place a towel between your skin and the bag.  Leave the ice on for 20 minutes, 2-3 times per day.  Take over-the-counter and prescription medicines only as told by your health care provider.  Begin doing exercises as told by your health care provider or physical therapist.  Always warm up properly before physical activity or sports.  Bend your knees before you lift heavy objects.  Keep all follow-up visits as told by your health care provider. This is important. SEEK MEDICAL CARE IF:  Your pain is not helped by medicine.  Your pain, bruising, or swelling is getting worse.  You have a fever. SEEK IMMEDIATE MEDICAL CARE IF:  You have shortness of breath.  You have chest pain.  You develop numbness or weakness in your legs.  You have involuntary loss of urine (urinary incontinence).   This information is not intended to replace advice given to you by your health care provider. Make sure you discuss any questions you have with your health care provider.   Document Released: 02/05/2004 Document Revised: 08/06/2015 Document Reviewed: 01/09/2015 Elsevier Interactive Patient Education 2016 Elsevier Inc.  [employer] Therapy Heat therapy can help ease sore, stiff, injured, and tight muscles and joints. Heat relaxes your muscles, which may help ease your pain. Heat therapy should only be used on old, pre-existing, or long-lasting (chronic) injuries. Do not use heat therapy unless told by your doctor. HOW TO USE HEAT THERAPY There are several different kinds of heat therapy, including:  Moist heat pack.  Warm water bath.  Hot water bottle.  Electric heating pad.  Heated gel pack.  Heated wrap.  Electric heating pad. GENERAL HEAT THERAPY RECOMMENDATIONS   Do not sleep while using heat therapy. Only use heat therapy while you are awake.  Your skin may turn pink while using heat therapy. Do not use heat therapy if your skin turns red.  Do not use heat therapy if you  have new pain.  High heat or long exposure to heat can cause burns. Be careful when using heat therapy to avoid burning your skin.  Do not use heat therapy on areas of your skin that are already irritated, such as with a rash or sunburn. GET HELP IF:   You have blisters, redness, swelling (puffiness), or numbness.  You have new pain.  Your pain is worse. MAKE SURE YOU:  Understand these instructions.  Will watch your condition.  Will get help right away if you are not doing well or get worse.   This information is not intended to replace advice given to you by your health care provider. Make sure you discuss any questions you have with your health care provider.   Document Released: 02/07/2012 Document Revised: 12/06/2014 Document Reviewed: 01/08/2014 Elsevier Interactive Patient Education [employer] Inc[employer]

## 2016-06-17 NOTE — ED Notes (Signed)
Pt ambulatory to lobby at this time. 

## 2016-06-17 NOTE — ED Notes (Signed)
Pt states back pain started on Sunday, reports old back injury is "exacerbated". Was at work when back pain started, denies any bowel or bladder issues.

## 2016-06-17 NOTE — ED Provider Notes (Signed)
CSN: [id number]     Arrival date & time 06/17/16  0455 History   First MD Initiated Contact with Patient 06/17/16 05:40 AM   Chief Complaint  Patient presents with  . Back Pain     (Consider location/radiation/quality/duration/timing/severity/associated sxs/prior Treatment) HPI patient states Sunday night, July 16 he started having some pain in his upper back on the right side. He finished his work that night and was off the following 2 nights. However he returned to work again last night and states about 1 AM this morning he started having worsening back pain again. He states the pain is sharp and constant and is in the middle of his back but more on the right side by the  shoulder blade. He states lifting, standing, and bending over makes the pain worse, he states laying down makes it feel better. He states he started a new job 3 months ago. 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 He states the pain radiates down both legs into his feet. He's not sure if that pain is from his back or from standing on cement all night. He denies any urinary or rectal incontinence. He denies any numbness in his extremities today. He states he's had this pain before, the last time he was treated for the's 2007 and he was treated by [facility]. He states they did physical therapy and he had one treatment for massage therapy which he states helped a lot. He states they told him he had a sprained muscle in his back. He did not have spinal injections or surgery done. Although patient states his last back pain was in 2007 he had several visits in 2014 for back pain.   [facility]  Past Medical History  Diagnosis Date  . Gout   . Back pain   . Hypertension   . Hypercholesteremia   . Gout    Past Surgical History  Procedure Laterality Date  . Breast surgery  1993    breast reduction   . Wrist surgery Left 2002    tendon repair   Family History    Problem Relation Age of Onset  . Diabetes Mother   . Diabetes Father   . Hypertension Father   . Hyperlipidemia Father   . COPD Father    Social History  Substance Use Topics  . Smoking status: Current Every Day Smoker -- 1.00 packs/day for 25 years    Types: Cigarettes  . Smokeless tobacco: Former Neurosurgeon  . Alcohol Use: Yes     Comment: rare  employed  Review of Systems  All other systems reviewed and are negative.     Allergies  Review of patient's allergies indicates no known allergies.  Home Medications   Prior to Admission medications   Medication Sig Start Date End Date Taking? Authorizing Provider  cyclobenzaprine (FLEXERIL) 5 MG tablet Take 1 tablet (5 mg total) by mouth 3 (three) times daily as needed. 06/17/16   Devoria Albe, MD  lisinopril (PRINIVIL,ZESTRIL) 20 MG tablet Take 1 tablet (20 mg total) by mouth daily. 04/06/16   Jacquelin Hawking, PA-C  lovastatin (MEVACOR) 20 MG tablet Take 1 tablet (20 mg total) by mouth at bedtime. 04/06/16   Jacquelin Hawking, PA-C  naproxen (NAPROSYN) 500 MG tablet Take 1 po BID with food prn pain 06/17/16   Devoria Albe, MD   BP 148/92 mmHg  Pulse 87  Temp(Src) 98.3 F (  36.8 C) (Oral)  Resp 20  Ht 5\' 6"  (1.676 m)  Wt 185 lb (83.915 kg)  BMI 29.87 kg/m2  SpO2 97%  Vital signs normal   Physical Exam  Constitutional: He is oriented to person, place, and time. He appears well-developed and well-nourished.  Non-toxic appearance. He does not appear ill. No distress.  HENT:  Head: Normocephalic and atraumatic.  Right Ear: External ear normal.  Left Ear: External ear normal.  Nose: Nose normal. No mucosal edema or rhinorrhea.  Mouth/Throat: Mucous membranes are normal. No dental abscesses or uvula swelling.  Eyes: Conjunctivae and EOM are normal.  Neck: Normal range of motion and full passive range of motion without pain.  Cardiovascular: Normal rate.   Pulmonary/Chest: Effort normal and breath sounds normal. No respiratory distress. He  has no rhonchi. He exhibits no crepitus.  Abdominal: Normal appearance.  Musculoskeletal: Normal range of motion. He exhibits no edema or tenderness.       Back:  Moves all extremities well.  Patient has some tenderness in his mid thoracic spine and just lateral to the spine. Patient has no pain with straight leg raising. Patellar reflexes are 2+ and equal bilaterally. His pain is made worse on extreme flexion forward and posteriorly of his shoulder.   Neurological: He is alert and oriented to person, place, and time. He has normal strength. No cranial nerve deficit.  Skin: Skin is warm, dry and intact. No rash noted. No erythema. No pallor.  Psychiatric: He has a normal mood and affect. His speech is normal and behavior is normal. His mood appears not anxious.  Nursing note and vitals reviewed.   ED Course  Procedures (including critical care time)  Medications  dexamethasone (DECADRON) injection 10 mg (not administered)  ketorolac (TORADOL) injection 60 mg (not administered)  diazepam (VALIUM) injection 5 mg (not administered)   Patient's pain appears to be musculoskeletal. He was given IM Decadron, IM Toradol, and Valium IM. He has a ride home with someone. We discussed using ice and heat to the area for comfort. He will be sent home with anti-inflammatory muscle relaxer. He states he does not have insurance right now and prefers [facility].    MDM   Final diagnoses:  Upper back pain on right side    New Prescriptions   CYCLOBENZAPRINE (FLEXERIL) 5 MG TABLET    Take 1 tablet (5 mg total) by mouth 3 (three) times daily as needed.   NAPROXEN (NAPROSYN) 500 MG TABLET    Take 1 po BID with food prn pain    Plan discharge  Devoria AlbeIva Argie Applegate, MD, Concha PyoFACEP     Cadence Minton, MD 06/17/16 [phone]

## 2016-07-06 ENCOUNTER — Ambulatory Visit: Payer: Self-pay | Admitting: Physician Assistant

## 2016-07-06 ENCOUNTER — Encounter: Payer: Self-pay | Admitting: Physician Assistant

## 2016-07-06 VITALS — BP 120/72 | HR 99 | Temp 97.3°F | Ht 66.5 in | Wt 172.5 lb

## 2016-07-06 DIAGNOSIS — M549 Dorsalgia, unspecified: Secondary | ICD-10-CM

## 2016-07-06 DIAGNOSIS — F17210 Nicotine dependence, cigarettes, uncomplicated: Secondary | ICD-10-CM

## 2016-07-06 DIAGNOSIS — E785 Hyperlipidemia, unspecified: Secondary | ICD-10-CM

## 2016-07-06 NOTE — Patient Instructions (Signed)

## 2016-07-06 NOTE — Progress Notes (Signed)
BP 120/72 (BP Location: Left Arm, Patient Position: Sitting, Cuff Size: Normal)   Pulse 99   Temp 97.3 F (36.3 C)   Ht 5' 6.5" (1.689 m)   Wt 172 lb 8 oz (78.2 kg)   SpO2 98%   BMI 27.43 kg/m    Subjective:    Patient ID: Ian Wall, male    DOB: [birth year], 40 y.o.   MRN: [id number]  HPI: Ian PleasureGary W Caserta is a 40 y.o. male presenting on 07/06/2016 for Hypertension and Hyperlipidemia   HPI  Pt is no longer working.  He says he has a little bit of depression but says not as bad as it was previously.   Pt states he went to ER for back pain.  He was recommended to use ice/heat and was given naprosyn and flexeril.  He is not using the ice/heat- he says he is "too lazy" to do this.  He is using the naprosyn and flexeril.     pt has lost some weight since his last OV.  He says he was trying to reduce his weight by changing what he eats and not eating as much.  Pt also went to the ER on 7/6 for chest pain.  K+ was low that day.  Pt says he doesn't remember them mentioning that but says they may have and he jsut doesn't remember.  Pt says he has had no more episodes of CP since that time.   Relevant past medical, surgical, family and social history reviewed and updated as indicated. Interim medical history since our last visit reviewed. Allergies and medications reviewed and updated.   Current Outpatient Prescriptions:  .  cyclobenzaprine (FLEXERIL) 5 MG tablet, Take 1 tablet (5 mg total) by mouth 3 (three) times daily as needed., Disp: 30 tablet, Rfl: 0 .  naproxen (NAPROSYN) 500 MG tablet, Take 1 po BID with food prn pain, Disp: 30 tablet, Rfl: 0 .  lisinopril (PRINIVIL,ZESTRIL) 20 MG tablet, Take 1 tablet (20 mg total) by mouth daily. (Patient not taking: Reported on 07/06/2016), Disp: 30 tablet, Rfl: 3 .  lovastatin (MEVACOR) 20 MG tablet, Take 1 tablet (20 mg total) by mouth at bedtime. (Patient not taking: Reported on 07/06/2016), Disp: 30 tablet, Rfl: 3   Review of Systems   Constitutional: Negative for appetite change, chills, diaphoresis, fatigue, fever and unexpected weight change.  HENT: Positive for dental problem. Negative for congestion, drooling, ear pain, facial swelling, hearing loss, mouth sores, sneezing, sore throat, trouble swallowing and voice change.   Eyes: Negative for pain, discharge, redness, itching and visual disturbance.  Respiratory: Negative for cough, choking, shortness of breath and wheezing.   Cardiovascular: Negative for chest pain, palpitations and leg swelling.  Gastrointestinal: Negative for abdominal pain, blood in stool, constipation, diarrhea and vomiting.  Endocrine: Negative for cold intolerance, heat intolerance and polydipsia.  Genitourinary: Negative for decreased urine volume, dysuria and hematuria.  Musculoskeletal: Positive for back pain. Negative for arthralgias and gait problem.  Skin: Negative for rash.  Allergic/Immunologic: Negative for environmental allergies.  Neurological: Negative for seizures, syncope, light-headedness and headaches.  Hematological: Negative for adenopathy.  Psychiatric/Behavioral: Positive for dysphoric mood. Negative for agitation and suicidal ideas. The patient is not nervous/anxious.     Per HPI unless specifically indicated above     Objective:    BP 120/72 (BP Location: Left Arm, Patient Position: Sitting, Cuff Size: Normal)   Pulse 99   Temp 97.3 F (36.3 C)   Ht 5' 6.5" (1.689  m)   Wt 172 lb 8 oz (78.2 kg)   SpO2 98%   BMI 27.43 kg/m   Wt Readings from Last 3 Encounters:  07/06/16 172 lb 8 oz (78.2 kg)  06/17/16 185 lb (83.9 kg)  06/03/16 185 lb (83.9 kg)    Physical Exam  Constitutional: He is oriented to person, place, and time. He appears well-developed and well-nourished.  HENT:  Head: Normocephalic and atraumatic.  Neck: Neck supple.  Cardiovascular: Normal rate and regular rhythm.   Pulmonary/Chest: Effort normal and breath sounds normal. He has no wheezes.   Abdominal: Soft. Bowel sounds are normal. There is no hepatosplenomegaly. There is no tenderness.  Musculoskeletal: He exhibits no edema.       Cervical back: Normal.       Thoracic back: He exhibits tenderness. He exhibits normal range of motion, no bony tenderness and no swelling.       Lumbar back: He exhibits tenderness. He exhibits normal range of motion, no bony tenderness and no swelling.  Mild soft tissue tenderness scattered diffusely. No point tenderness. No bony tenderness  Lymphadenopathy:    He has no cervical adenopathy.  Neurological: He is alert and oriented to person, place, and time.  Skin: Skin is warm and dry.  Psychiatric: He has a normal mood and affect. His behavior is normal.  Vitals reviewed.       Assessment & Plan:   Encounter Diagnoses  Name Primary?  . Hyperlipidemia Yes  . Cigarette nicotine dependence, uncomplicated   . Back pain, unspecified location      -need to Recheck K+ since it was low when checked in the ER on 06/03/16 -will Check lipids to see if pt neeeds the statin in light of improved eating habits and weight loss -for back, discussed with pt the need for Using ice/heat.  Also encouraged Back exercises- gave handout/instructions on AVS. Pt can continue flexeril and naprosyn -pt can stop lisininopril- he hasn't taken it in months and his bp is improved with his weight loss -will check lipids/labs to see if pt needs to take the lovastatin -F/u 3 month.  RTO sooner prn

## 2016-10-12 ENCOUNTER — Ambulatory Visit: Payer: Self-pay | Admitting: Physician Assistant

## 2016-10-14 ENCOUNTER — Encounter: Payer: Self-pay | Admitting: Physician Assistant

## 2017-06-18 IMAGING — DX DG CHEST 2V
2 series · 2 of 2 positions shown · non-contrast
Comparison: None.

CLINICAL DATA: Sudden onset of chest pain 3 hours prior.

EXAM:
CHEST  2 VIEW

[chest pa]
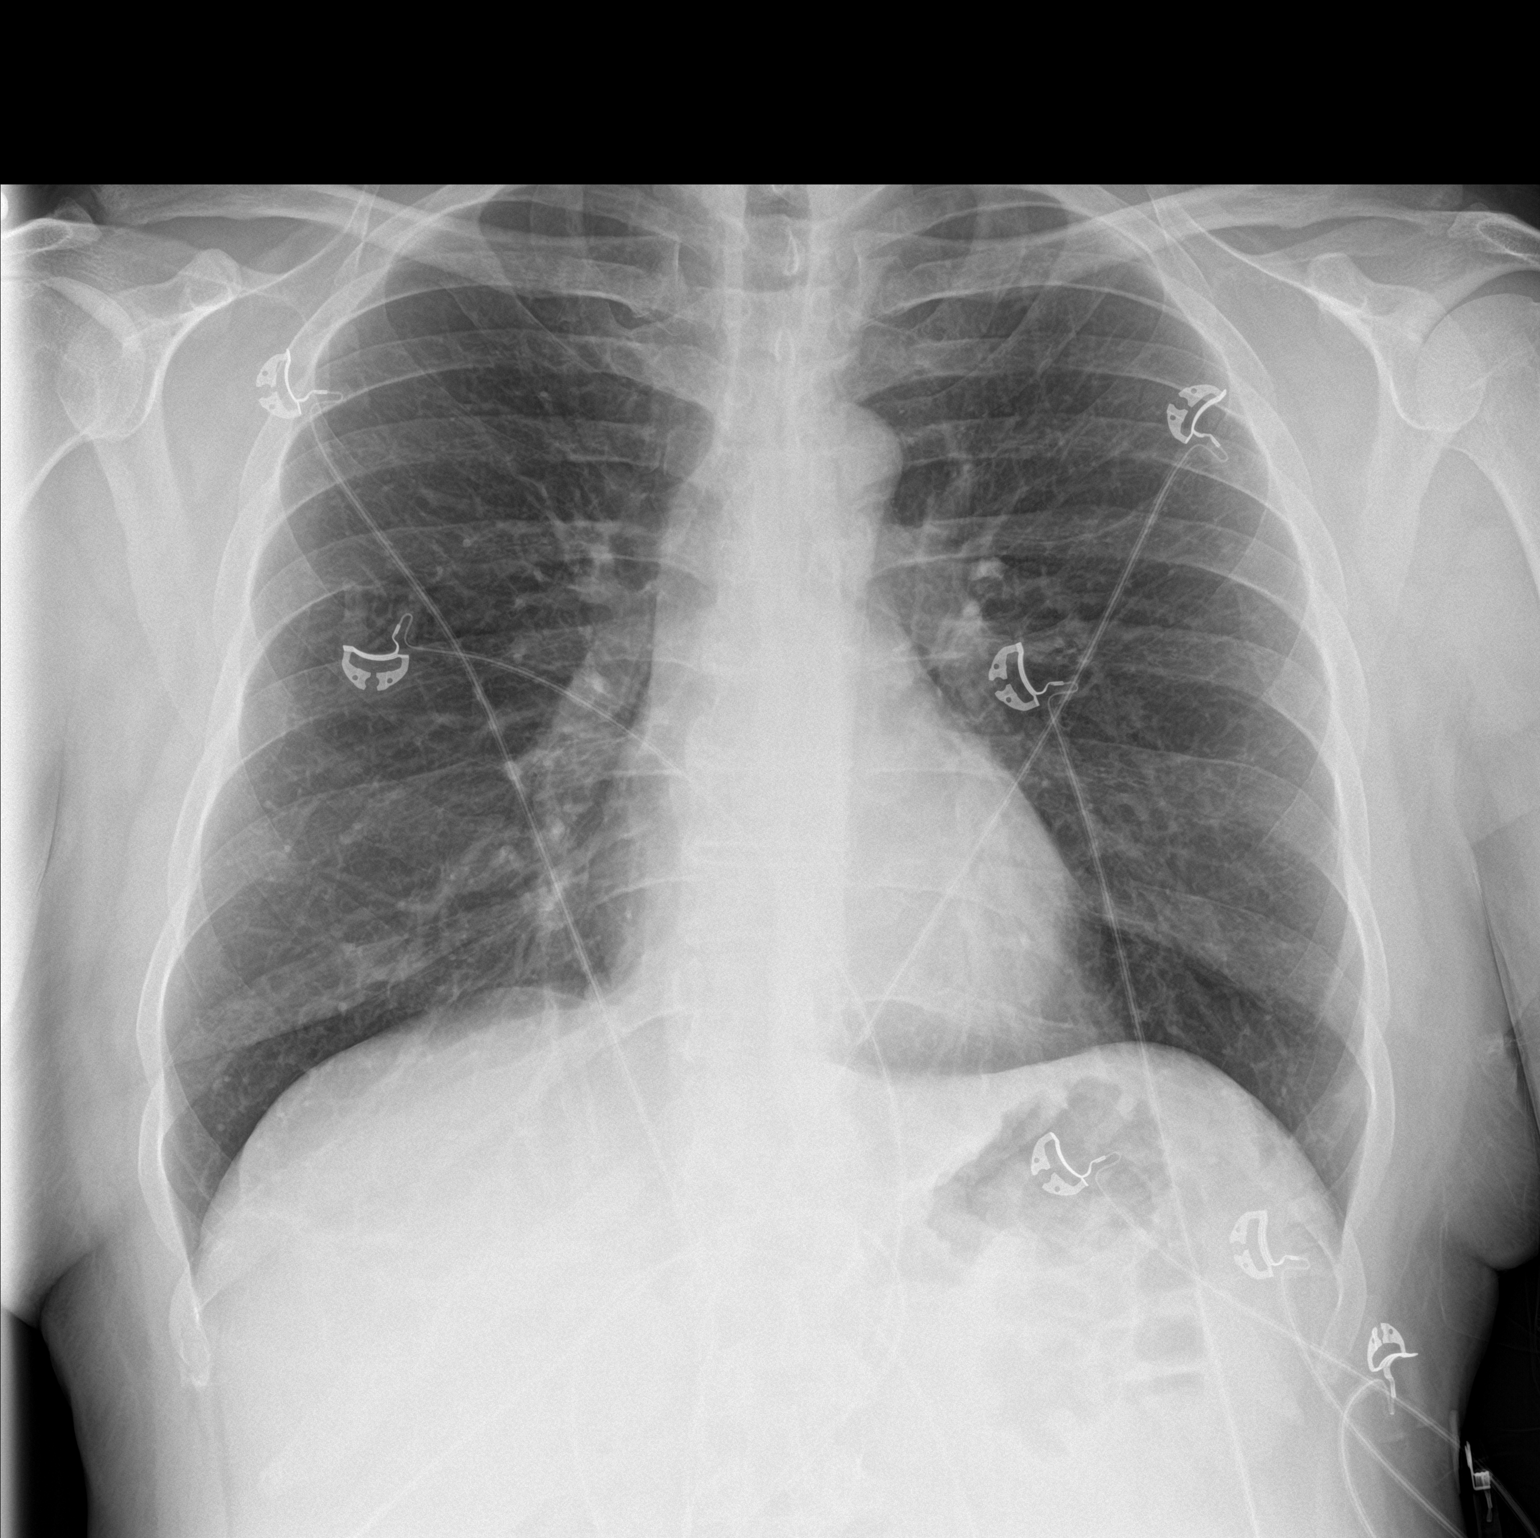

[chest lat]
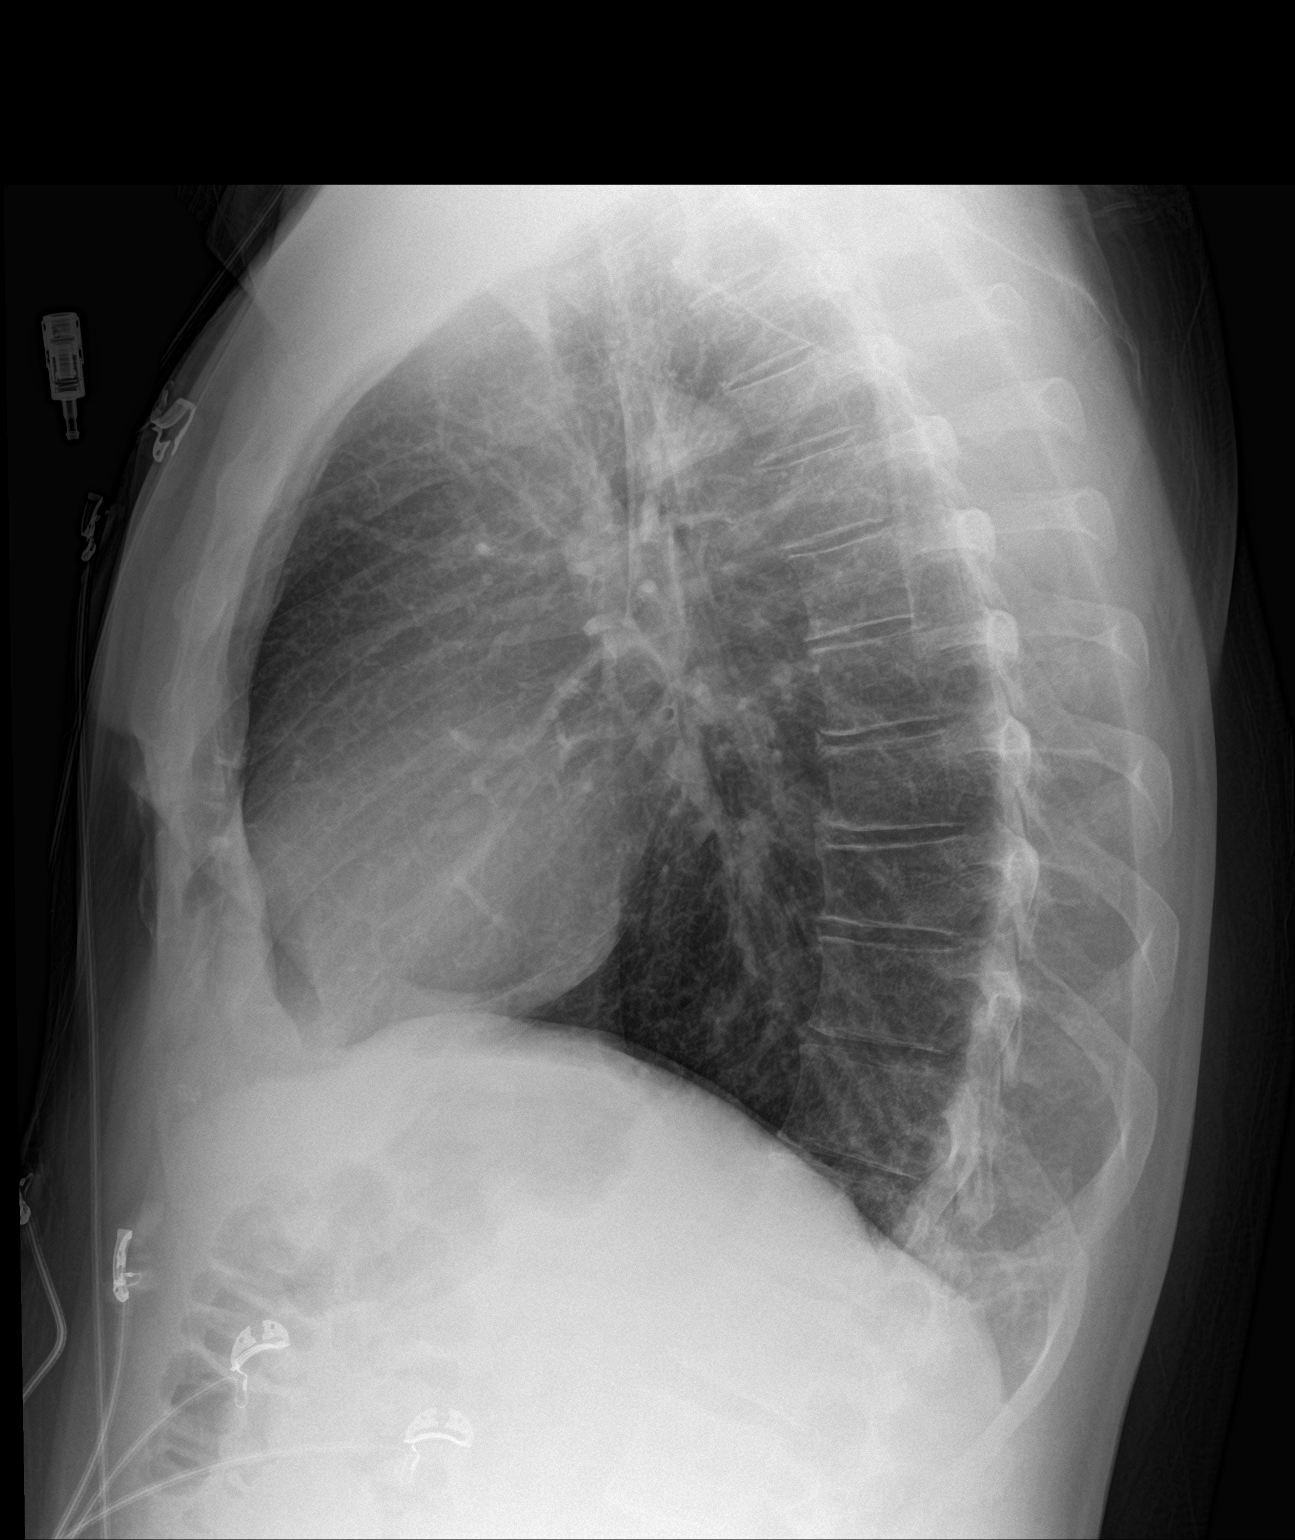

[2 of 2 positions shown; findings below may reference images not displayed]

FINDINGS: The cardiomediastinal contours are normal. The lungs are clear.
Pulmonary vasculature is normal. No consolidation, pleural effusion,
or pneumothorax. No acute osseous abnormalities are seen.
IMPRESSION: No acute pulmonary process.

## 2018-05-26 ENCOUNTER — Emergency Department
Admission: EM | Admit: 2018-05-26 | Discharge: 2018-05-26 | Disposition: A | Discharge status: Home / Self Care | Payer: Self-pay | Attending: Emergency Medicine | Admitting: Emergency Medicine

## 2018-05-26 ENCOUNTER — Encounter: Payer: Self-pay | Admitting: Emergency Medicine

## 2018-05-26 DIAGNOSIS — Z79899 Other long term (current) drug therapy: Secondary | ICD-10-CM | POA: Insufficient documentation

## 2018-05-26 DIAGNOSIS — I10 Essential (primary) hypertension: Secondary | ICD-10-CM | POA: Insufficient documentation

## 2018-05-26 DIAGNOSIS — M436 Torticollis: Secondary | ICD-10-CM | POA: Insufficient documentation

## 2018-05-26 DIAGNOSIS — F17210 Nicotine dependence, cigarettes, uncomplicated: Secondary | ICD-10-CM | POA: Insufficient documentation

## 2018-05-26 MED ORDER — HYDROCODONE-ACETAMINOPHEN 5-325 MG PO TABS
1.0000 | ORAL_TABLET | Freq: Once | ORAL | Status: AC
  Administered 2018-05-26: 1 via ORAL
  Filled 2018-05-26: qty 1

## 2018-05-26 MED ORDER — KETOROLAC TROMETHAMINE 30 MG/ML IJ SOLN
30.0000 mg | Freq: Once | INTRAMUSCULAR | Status: AC
  Administered 2018-05-26: 30 mg via INTRAMUSCULAR
  Filled 2018-05-26: qty 1

## 2018-05-26 MED ORDER — METHOCARBAMOL 500 MG PO TABS: ORAL_TABLET | ORAL | 0 refills | Status: AC

## 2018-05-26 MED ORDER — HYDROCODONE-ACETAMINOPHEN 5-325 MG PO TABS: 1.0000 | ORAL_TABLET | Freq: Four times a day (QID) | ORAL | 0 refills | Status: AC | PRN

## 2018-05-26 MED ORDER — METHOCARBAMOL 500 MG PO TABS
1000.0000 mg | ORAL_TABLET | Freq: Once | ORAL | Status: AC
  Administered 2018-05-26: 1000 mg via ORAL
  Filled 2018-05-26: qty 2

## 2018-05-26 MED ORDER — NAPROXEN 500 MG PO TABS: 500.0000 mg | ORAL_TABLET | Freq: Two times a day (BID) | ORAL | 0 refills | Status: AC

## 2018-05-26 NOTE — ED Triage Notes (Signed)
Pt woke this AM with stiff right side of neck. Pt reports he has had pain in the area for "years" but this AM he was unable to move properly and the pain was increased. No swelling noted to area.

## 2018-05-26 NOTE — ED Provider Notes (Signed)
[facility] Emergency Department Provider Note  ____________________________________________   None    (approximate)  I have reviewed the triage vital signs and the nursing notes.   HISTORY  Chief Complaint Torticollis  HPI Ian Wall is a 42 y.o. male is here with complaint of "stiff neck" since waking up this morning.  Patient states that he has had pain off and on in his neck for years.  He denies any recent injury.  Patient states that this morning he was unable to move without increased pain.  He has not taken any over-the-counter medication this morning for this.  He denies any recent illness, no fever, chills, nausea or vomiting.  He denies any headache or paresthesias.  Currently rates his pain as 7/10.   Past Medical History:  Diagnosis Date  . Back pain   . Gout   . Gout   . Hypercholesteremia   . Hypertension     Patient Active Problem List   Diagnosis Date Noted  . Depression 03/09/2016  . Cigarette nicotine dependence with nicotine-induced disorder 03/09/2016  . Pain in joint of right shoulder 12/07/2015  . Obesity, unspecified 12/07/2015  . Essential hypertension 10/27/2015  . Hyperlipidemia 10/27/2015  . Cigarette nicotine dependence, uncomplicated 10/27/2015    Past Surgical History:  Procedure Laterality Date  . BREAST SURGERY  1993   breast reduction   . WRIST SURGERY Left 2002   tendon repair    Prior to Admission medications   Medication Sig Start Date End Date Taking? Authorizing Provider  HYDROcodone-acetaminophen (NORCO/VICODIN) 5-325 MG tablet Take 1 tablet by mouth every 6 (six) hours as needed for moderate pain. 05/26/18   Tommi Rumps, PA-C  lisinopril (PRINIVIL,ZESTRIL) 20 MG tablet Take 1 tablet (20 mg total) by mouth daily. Patient not taking: Reported on 07/06/2016 04/06/16   Jacquelin Hawking, PA-C  lovastatin (MEVACOR) 20 MG tablet Take 1 tablet (20 mg total) by mouth at bedtime. Patient not taking:  Reported on 07/06/2016 04/06/16   Jacquelin Hawking, PA-C  methocarbamol (ROBAXIN) 500 MG tablet 1 or 2 tablets every 6 hours as needed muscle spasms 05/26/18   Tommi Rumps, PA-C  naproxen (NAPROSYN) 500 MG tablet Take 1 tablet (500 mg total) by mouth 2 (two) times daily with a meal. 05/26/18   Tommi Rumps, PA-C    Allergies Patient has no known allergies.  Family History  Problem Relation Age of Onset  . Diabetes Mother   . Diabetes Father   . Hypertension Father   . Hyperlipidemia Father   . COPD Father     Social History Social History   Tobacco Use  . Smoking status: Current Every Day Smoker    Packs/day: 1.00    Years: 25.00    Pack years: 25.00    Types: Cigarettes  . Smokeless tobacco: Former Engineer, water Use Topics  . Alcohol use: Yes    Comment: rare  . Drug use: No    Review of Systems Constitutional: No fever/chills Cardiovascular: Denies chest pain. Respiratory: Denies shortness of breath. Gastrointestinal:  No nausea, no vomiting.   Musculoskeletal: Positive for cervical pain. Neurological: Negative for headaches, focal weakness or numbness. ____________________________________________   PHYSICAL EXAM:  VITAL SIGNS: ED Triage Vitals  Enc Vitals Group     BP 05/26/18 0617 (!) 145/90     Pulse Rate 05/26/18 0617 88     Resp 05/26/18 0617 17     Temp 05/26/18 0617 98 F (36.7 C)  Temp Source 05/26/18 0617 Oral     SpO2 05/26/18 0617 99 %     Weight 05/26/18 0616 200 lb (90.7 kg)     Height --      Head Circumference --      Peak Flow --      Pain Score 05/26/18 0616 7     Pain Loc --      Pain Edu? --      Excl. in GC? --    Constitutional: Alert and oriented. Well appearing and in no acute distress. Eyes: Conjunctivae are normal.  Head: Atraumatic. Nose: No congestion/rhinnorhea. Neck: No stridor.  There is tenderness on palpation of the right lateral cervical and trapezius muscle.  Range of motion is restricted laterally  secondary to pain.  Patient is able to flex and hyperextend his neck without any difficulty.  No ecchymosis, abrasions, soft tissue swelling noted. Hematological/Lymphatic/Immunilogical: No cervical lymphadenopathy. Cardiovascular: Normal rate, regular rhythm. Grossly normal heart sounds.  Good peripheral circulation. Respiratory: Normal respiratory effort.  No retractions. Lungs CTAB. Musculoskeletal: Moves upper and lower extremities any difficulty.  Normal gait was noted. Neurologic:  Normal speech and language. No gross focal neurologic deficits are appreciated. No gait instability. Skin:  Skin is warm, dry and intact. No rash noted. Psychiatric: Mood and affect are normal. Speech and behavior are normal.  ____________________________________________   LABS (all labs ordered are listed, but only abnormal results are displayed)  Labs Reviewed - No data to display  PROCEDURES  Procedure(s) performed: None  Procedures  Critical Care performed: No  ____________________________________________   INITIAL IMPRESSION / ASSESSMENT AND PLAN / ED COURSE  As part of my medical decision making, I reviewed the following data within the electronic MEDICAL RECORD NUMBER Notes from prior ED visits and [place] Controlled Substance Database  Patient's findings are consistent with torticollis.  Patient was given a Toradol injection 30 mg IM, Norco 1 tablet and methocarbamol 1000 mg while in the ED.  Patient improved.  He was discharged with instructions to use ice or heat to his cervical muscles.  He will also continue with methocarbamol 1 or 2 tablets every 6 hours as needed for muscle spasms, Norco every 6 hours as needed for pain and naproxen 500 mg twice daily with food.  He is to follow-up with his PCP if any continued problems. ____________________________________________   FINAL CLINICAL IMPRESSION(S) / ED DIAGNOSES  Final diagnoses:  Torticollis, acute     ED Discharge Orders        Ordered      methocarbamol (ROBAXIN) 500 MG tablet     05/26/18 0802    HYDROcodone-acetaminophen (NORCO/VICODIN) 5-325 MG tablet  Every 6 hours PRN     05/26/18 0802    naproxen (NAPROSYN) 500 MG tablet  2 times daily with meals     05/26/18 0802       Note:  This document was prepared using Dragon voice recognition software and may include unintentional dictation errors.    Tommi RumpsSummers, Rhonda L, PA-C 05/26/18 [id number]    Dionne BucySiadecki, Sebastian, MD 05/26/18 [phone]

## 2018-05-26 NOTE — Discharge Instructions (Addendum)
Make an appointment with your primary care provider if any continued problems.  Begin using warm compresses or ice packs to your neck as needed for discomfort.  Continue taking medication as directed.  Methocarbamol 1 or 2 tablets every 6 hours as needed for muscle spasms and Norco every 6 hours as needed for moderate pain.  Both these medications can cause drowsiness and increase your risk for injury.  Do not drive while taking this medication.  Naproxen 500 mg twice daily with food to be taken every day.

## 2018-05-26 NOTE — ED Notes (Signed)
A/o neck pain with headache. NAD.

## 2018-11-27 ENCOUNTER — Other Ambulatory Visit: Payer: Self-pay

## 2018-11-27 ENCOUNTER — Emergency Department
Admission: EM | Admit: 2018-11-27 | Discharge: 2018-11-27 | Disposition: A | Discharge status: Home / Self Care | Payer: Self-pay | Attending: Emergency Medicine | Admitting: Emergency Medicine

## 2018-11-27 ENCOUNTER — Encounter: Payer: Self-pay | Admitting: *Deleted

## 2018-11-27 DIAGNOSIS — J101 Influenza due to other identified influenza virus with other respiratory manifestations: Secondary | ICD-10-CM | POA: Insufficient documentation

## 2018-11-27 DIAGNOSIS — Z79899 Other long term (current) drug therapy: Secondary | ICD-10-CM | POA: Insufficient documentation

## 2018-11-27 DIAGNOSIS — F17210 Nicotine dependence, cigarettes, uncomplicated: Secondary | ICD-10-CM | POA: Insufficient documentation

## 2018-11-27 DIAGNOSIS — I10 Essential (primary) hypertension: Secondary | ICD-10-CM | POA: Insufficient documentation

## 2018-11-27 LAB — INFLUENZA PANEL BY PCR (TYPE A & B)
Influenza A By PCR: POSITIVE — AB
Influenza B By PCR: NEGATIVE

## 2018-11-27 MED ORDER — BENZONATATE 100 MG PO CAPS: 100.0000 mg | ORAL_CAPSULE | Freq: Three times a day (TID) | ORAL | 0 refills | Status: AC | PRN

## 2018-11-27 MED ORDER — FLUTICASONE PROPIONATE 50 MCG/ACT NA SUSP: 2.0000 | Freq: Every day | NASAL | 0 refills | Status: AC

## 2018-11-27 MED ORDER — OSELTAMIVIR PHOSPHATE 75 MG PO CAPS: 75.0000 mg | ORAL_CAPSULE | Freq: Two times a day (BID) | ORAL | 0 refills | Status: AC

## 2018-11-27 MED ORDER — IBUPROFEN 600 MG PO TABS: 600.0000 mg | ORAL_TABLET | Freq: Four times a day (QID) | ORAL | 0 refills | Status: AC | PRN

## 2018-11-27 MED ORDER — ACETAMINOPHEN 500 MG PO TABS: 500.0000 mg | ORAL_TABLET | Freq: Four times a day (QID) | ORAL | 0 refills | Status: AC | PRN

## 2018-11-27 NOTE — ED Provider Notes (Signed)
[facility] Emergency Department Provider Note  ____________________________________________  Time seen: Approximately 9:47 PM  I have reviewed the triage vital signs and the nursing notes.   HISTORY  Chief Complaint Cough    HPI Ian Wall is a 43 y.o. male presents emergency department for evaluation of chills, headache, scratchy throat, occasional cough for 1 day.  Patient states that where he is staying, there are other guys with influenza.  He smokes a pack of cigarettes per day.  No asthma or allergies.  No shortness of breath, chest pain, vomiting, abdominal pain, diarrhea.  Past Medical History:  Diagnosis Date  . Back pain   . Gout   . Gout   . Hypercholesteremia   . Hypertension     Patient Active Problem List   Diagnosis Date Noted  . Depression 03/09/2016  . Cigarette nicotine dependence with nicotine-induced disorder 03/09/2016  . Pain in joint of right shoulder 12/07/2015  . Obesity, unspecified 12/07/2015  . Essential hypertension 10/27/2015  . Hyperlipidemia 10/27/2015  . Cigarette nicotine dependence, uncomplicated 10/27/2015    Past Surgical History:  Procedure Laterality Date  . BREAST SURGERY  1993   breast reduction   . WRIST SURGERY Left 2002   tendon repair    Prior to Admission medications   Medication Sig Start Date End Date Taking? Authorizing Provider  acetaminophen (TYLENOL) 500 MG tablet Take 1 tablet (500 mg total) by mouth every 6 (six) hours as needed. 11/27/18   Enid Derry, PA-C  benzonatate (TESSALON PERLES) 100 MG capsule Take 1 capsule (100 mg total) by mouth 3 (three) times daily as needed for cough. 11/27/18 11/27/19  Enid Derry, PA-C  fluticasone (FLONASE) 50 MCG/ACT nasal spray Place 2 sprays into both nostrils daily. 11/27/18 11/27/19  Enid Derry, PA-C  HYDROcodone-acetaminophen (NORCO/VICODIN) 5-325 MG tablet Take 1 tablet by mouth every 6 (six) hours as needed for moderate pain.  05/26/18   Tommi Rumps, PA-C  ibuprofen (ADVIL,MOTRIN) 600 MG tablet Take 1 tablet (600 mg total) by mouth every 6 (six) hours as needed. 11/27/18   Enid Derry, PA-C  lisinopril (PRINIVIL,ZESTRIL) 20 MG tablet Take 1 tablet (20 mg total) by mouth daily. Patient not taking: Reported on 07/06/2016 04/06/16   Jacquelin Hawking, PA-C  lovastatin (MEVACOR) 20 MG tablet Take 1 tablet (20 mg total) by mouth at bedtime. Patient not taking: Reported on 07/06/2016 04/06/16   Jacquelin Hawking, PA-C  methocarbamol (ROBAXIN) 500 MG tablet 1 or 2 tablets every 6 hours as needed muscle spasms 05/26/18   Tommi Rumps, PA-C  naproxen (NAPROSYN) 500 MG tablet Take 1 tablet (500 mg total) by mouth 2 (two) times daily with a meal. 05/26/18   Tommi Rumps, PA-C  oseltamivir (TAMIFLU) 75 MG capsule Take 1 capsule (75 mg total) by mouth 2 (two) times daily for 5 days. 11/27/18 12/02/18  Enid Derry, PA-C    Allergies Patient has no known allergies.  Family History  Problem Relation Age of Onset  . Diabetes Mother   . Diabetes Father   . Hypertension Father   . Hyperlipidemia Father   . COPD Father     Social History Social History   Tobacco Use  . Smoking status: Current Every Day Smoker    Packs/day: 1.00    Years: 25.00    Pack years: 25.00    Types: Cigarettes  . Smokeless tobacco: Former Engineer, water Use Topics  . Alcohol use: Not Currently    Comment: rare  .  Drug use: No     Review of Systems  Constitutional: Positive for chills Eyes: No visual changes. No discharge. ENT: Positive for congestion and rhinorrhea. Cardiovascular: No chest pain. Respiratory: Positive for cough. No SOB. Gastrointestinal: No abdominal pain.  No nausea, no vomiting.  No diarrhea.  No constipation. Musculoskeletal: Positive for body aches Skin: Negative for rash, abrasions, lacerations, ecchymosis. Neurological: Positive for headache   ____________________________________________   PHYSICAL  EXAM:  VITAL SIGNS: ED Triage Vitals  Enc Vitals Group     BP 11/27/18 2014 (!) 147/92     Pulse Rate 11/27/18 2014 (!) 106     Resp 11/27/18 2014 16     Temp 11/27/18 2014 98.9 F (37.2 C)     Temp Source 11/27/18 2014 Oral     SpO2 11/27/18 2014 94 %     Weight 11/27/18 2014 225 lb (102.1 kg)     Height 11/27/18 2014 5\' 6"  (1.676 m)     Head Circumference --      Peak Flow --      Pain Score 11/27/18 2027 7     Pain Loc --      Pain Edu? --      Excl. in GC? --      Constitutional: Alert and oriented. Well appearing and in no acute distress. Eyes: Conjunctivae are normal. PERRL. EOMI. No discharge. Head: Atraumatic. ENT: No frontal and maxillary sinus tenderness.      Ears: Tympanic membranes pink. No discharge.      Nose: Mild congestion/rhinnorhea.      Mouth/Throat: Mucous membranes are moist. Oropharynx non-erythematous. Tonsils not enlarged. No exudates. Uvula midline. Neck: No stridor.   Hematological/Lymphatic/Immunilogical: No cervical lymphadenopathy. Cardiovascular: Normal rate, regular rhythm.  Good peripheral circulation. Respiratory: Normal respiratory effort without tachypnea or retractions. Lungs CTAB. Good air entry to the bases with no decreased or absent breath sounds. Gastrointestinal: Bowel sounds 4 quadrants. Soft and nontender to palpation. No guarding or rigidity. No palpable masses. No distention. Musculoskeletal: Full range of motion to all extremities. No gross deformities appreciated. Neurologic:  Normal speech and language. No gross focal neurologic deficits are appreciated.  Skin:  Skin is warm, dry and intact. No rash noted. Psychiatric: Mood and affect are normal. Speech and behavior are normal. Patient exhibits appropriate insight and judgement.   ____________________________________________   LABS (all labs ordered are listed, but only abnormal results are displayed)  Labs Reviewed  INFLUENZA PANEL BY PCR (TYPE A & B) - Abnormal;  Notable for the following components:      Result Value   Influenza A By PCR POSITIVE (*)    All other components within normal limits   ____________________________________________  EKG   ____________________________________________  RADIOLOGY   No results found.  ____________________________________________    PROCEDURES  Procedure(s) performed:    Procedures    Medications - No data to display   ____________________________________________   INITIAL IMPRESSION / ASSESSMENT AND PLAN / ED COURSE  Pertinent labs & imaging results that were available during my care of the patient were reviewed by me and considered in my medical decision making (see chart for details).  Review of the [place] CSRS was performed in accordance of the [facility] prior to dispensing any controlled drugs.     Patient's diagnosis is consistent with influenza A. Vital signs and exam are reassuring.  Influenza test is positive.  Patient appears well.  Patient appears well and is staying well hydrated. Patient should alternate tylenol and ibuprofen  for fever. Patient feels comfortable going home. Patient will be discharged home with prescriptions for Tamiflu, Motrin, Tylenol, Bromfed, Flonase. Patient is to follow up with primary care as needed or otherwise directed. Patient is given ED precautions to return to the ED for any worsening or new symptoms.     ____________________________________________  FINAL CLINICAL IMPRESSION(S) / ED DIAGNOSES  Final diagnoses:  Influenza A      NEW MEDICATIONS STARTED DURING THIS VISIT:  ED Discharge Orders         Ordered    oseltamivir (TAMIFLU) 75 MG capsule  2 times daily     11/27/18 2212    ibuprofen (ADVIL,MOTRIN) 600 MG tablet  Every 6 hours PRN     11/27/18 2212    acetaminophen (TYLENOL) 500 MG tablet  Every 6 hours PRN     11/27/18 2212    benzonatate (TESSALON PERLES) 100 MG capsule  3 times daily PRN     11/27/18 2212    fluticasone  (FLONASE) 50 MCG/ACT nasal spray  Daily     11/27/18 2212              This chart was dictated using voice recognition software/Dragon. Despite best efforts to proofread, errors can occur which can change the meaning. Any change was purely unintentional.    Enid DerryWagner, Cassiopeia Florentino, PA-C 11/28/18 0000    Phineas SemenGoodman, Graydon, MD 11/30/18 [phone]

## 2018-11-27 NOTE — ED Triage Notes (Signed)
Patient c/o cough, body aches, and headache since yesterday.

## 2018-11-27 NOTE — ED Notes (Signed)
Pt c/o cough and chest congestion at this time. Pt states he has had exposure to the flu recently. Pt denies SOB or chest pain at this time.

## 2019-11-26 ENCOUNTER — Ambulatory Visit: Payer: Self-pay | Attending: Internal Medicine

## 2019-11-26 ENCOUNTER — Other Ambulatory Visit: Payer: Self-pay

## 2019-11-26 DIAGNOSIS — Z20822 Contact with and (suspected) exposure to covid-19: Secondary | ICD-10-CM

## 2019-11-28 LAB — NOVEL CORONAVIRUS, NAA: SARS-CoV-2, NAA: NOT DETECTED

## 2019-12-03 ENCOUNTER — Telehealth: Payer: Self-pay | Admitting: General Practice

## 2019-12-03 NOTE — Telephone Encounter (Signed)
Negative COVID results given. Patient results "NOT Detected." Caller expressed understanding. ° °

## 2019-12-04 ENCOUNTER — Telehealth: Payer: Self-pay | Admitting: General Practice

## 2019-12-04 NOTE — Telephone Encounter (Signed)
Negative COVID results given. Patient results "NOT Detected." Caller expressed understanding. ° °

## 2022-10-07 DIAGNOSIS — R69 Illness, unspecified: Secondary | ICD-10-CM | POA: Diagnosis not present
